# Patient Record
Sex: Male | Born: 1965 | Race: White | Hispanic: No | Smoking: Current every day smoker
Health system: Southern US, Community
[De-identification: ages and names within clinical notes are randomized; demographics above are authoritative.]

## PROBLEM LIST (undated history)

## (undated) DIAGNOSIS — I639 Cerebral infarction, unspecified: Secondary | ICD-10-CM

## (undated) DIAGNOSIS — M549 Dorsalgia, unspecified: Secondary | ICD-10-CM

## (undated) DIAGNOSIS — R269 Unspecified abnormalities of gait and mobility: Secondary | ICD-10-CM

## (undated) DIAGNOSIS — I10 Essential (primary) hypertension: Secondary | ICD-10-CM

## (undated) DIAGNOSIS — W19XXXA Unspecified fall, initial encounter: Secondary | ICD-10-CM

## (undated) DIAGNOSIS — R296 Repeated falls: Secondary | ICD-10-CM

## (undated) DIAGNOSIS — G8929 Other chronic pain: Secondary | ICD-10-CM

## (undated) DIAGNOSIS — E7800 Pure hypercholesterolemia, unspecified: Secondary | ICD-10-CM

## (undated) HISTORY — PX: BACK SURGERY: SHX140

## (undated) HISTORY — DX: Unspecified abnormalities of gait and mobility: R26.9

---

## 2001-06-12 ENCOUNTER — Encounter: Payer: Self-pay | Admitting: Family Medicine

## 2001-06-12 ENCOUNTER — Ambulatory Visit (HOSPITAL_COMMUNITY): Admission: RE | Admit: 2001-06-12 | Discharge: 2001-06-12 | Payer: Self-pay | Admitting: Family Medicine

## 2001-08-11 ENCOUNTER — Emergency Department (HOSPITAL_COMMUNITY): Admission: EM | Admit: 2001-08-11 | Discharge: 2001-08-11 | Payer: Self-pay | Admitting: *Deleted

## 2002-02-10 ENCOUNTER — Encounter: Payer: Self-pay | Admitting: Neurosurgery

## 2002-02-10 ENCOUNTER — Encounter: Admission: RE | Admit: 2002-02-10 | Discharge: 2002-02-10 | Payer: Self-pay | Admitting: Neurosurgery

## 2002-02-25 ENCOUNTER — Encounter: Payer: Self-pay | Admitting: Neurosurgery

## 2002-02-25 ENCOUNTER — Encounter: Admission: RE | Admit: 2002-02-25 | Discharge: 2002-02-25 | Payer: Self-pay | Admitting: Neurosurgery

## 2002-02-28 ENCOUNTER — Emergency Department (HOSPITAL_COMMUNITY): Admission: EM | Admit: 2002-02-28 | Discharge: 2002-02-28 | Payer: Self-pay | Admitting: Emergency Medicine

## 2002-03-06 ENCOUNTER — Encounter: Payer: Self-pay | Admitting: Neurosurgery

## 2002-03-10 ENCOUNTER — Inpatient Hospital Stay (HOSPITAL_COMMUNITY): Admission: RE | Admit: 2002-03-10 | Discharge: 2002-03-11 | Payer: Self-pay | Admitting: Neurosurgery

## 2002-03-10 ENCOUNTER — Encounter: Payer: Self-pay | Admitting: Neurosurgery

## 2002-09-16 ENCOUNTER — Encounter: Payer: Self-pay | Admitting: Neurosurgery

## 2002-09-16 ENCOUNTER — Ambulatory Visit (HOSPITAL_COMMUNITY): Admission: RE | Admit: 2002-09-16 | Discharge: 2002-09-16 | Payer: Self-pay | Admitting: Neurosurgery

## 2002-12-30 ENCOUNTER — Emergency Department (HOSPITAL_COMMUNITY): Admission: EM | Admit: 2002-12-30 | Discharge: 2002-12-31 | Payer: Self-pay | Admitting: *Deleted

## 2003-03-25 ENCOUNTER — Emergency Department (HOSPITAL_COMMUNITY): Admission: EM | Admit: 2003-03-25 | Discharge: 2003-03-26 | Payer: Self-pay | Admitting: *Deleted

## 2003-03-26 ENCOUNTER — Ambulatory Visit (HOSPITAL_COMMUNITY): Admission: RE | Admit: 2003-03-26 | Discharge: 2003-03-26 | Payer: Self-pay | Admitting: *Deleted

## 2003-04-25 ENCOUNTER — Inpatient Hospital Stay (HOSPITAL_COMMUNITY): Admission: EM | Admit: 2003-04-25 | Discharge: 2003-04-28 | Payer: Self-pay | Admitting: *Deleted

## 2003-04-29 ENCOUNTER — Emergency Department (HOSPITAL_COMMUNITY): Admission: EM | Admit: 2003-04-29 | Discharge: 2003-04-30 | Payer: Self-pay | Admitting: *Deleted

## 2003-05-27 ENCOUNTER — Emergency Department (HOSPITAL_COMMUNITY): Admission: EM | Admit: 2003-05-27 | Discharge: 2003-05-27 | Payer: Self-pay | Admitting: Emergency Medicine

## 2004-06-30 ENCOUNTER — Emergency Department (HOSPITAL_COMMUNITY): Admission: EM | Admit: 2004-06-30 | Discharge: 2004-07-01 | Payer: Self-pay | Admitting: *Deleted

## 2004-07-03 ENCOUNTER — Ambulatory Visit (HOSPITAL_COMMUNITY): Admission: RE | Admit: 2004-07-03 | Discharge: 2004-07-03 | Payer: Self-pay | Admitting: Family Medicine

## 2004-07-07 ENCOUNTER — Ambulatory Visit (HOSPITAL_COMMUNITY): Admission: RE | Admit: 2004-07-07 | Discharge: 2004-07-07 | Payer: Self-pay | Admitting: Family Medicine

## 2004-07-14 ENCOUNTER — Inpatient Hospital Stay (HOSPITAL_COMMUNITY): Admission: AD | Admit: 2004-07-14 | Discharge: 2004-07-18 | Payer: Self-pay | Admitting: Family Medicine

## 2004-07-14 ENCOUNTER — Ambulatory Visit: Payer: Self-pay | Admitting: Internal Medicine

## 2004-10-10 ENCOUNTER — Ambulatory Visit: Payer: Self-pay | Admitting: Gastroenterology

## 2004-10-11 ENCOUNTER — Ambulatory Visit (HOSPITAL_COMMUNITY): Admission: RE | Admit: 2004-10-11 | Discharge: 2004-10-11 | Payer: Self-pay | Admitting: Internal Medicine

## 2004-10-15 ENCOUNTER — Emergency Department (HOSPITAL_COMMUNITY): Admission: EM | Admit: 2004-10-15 | Discharge: 2004-10-16 | Payer: Self-pay | Admitting: Emergency Medicine

## 2004-10-18 ENCOUNTER — Ambulatory Visit: Payer: Self-pay | Admitting: Gastroenterology

## 2004-10-25 ENCOUNTER — Ambulatory Visit (HOSPITAL_COMMUNITY): Admission: RE | Admit: 2004-10-25 | Discharge: 2004-10-25 | Payer: Self-pay | Admitting: Urology

## 2004-11-02 ENCOUNTER — Emergency Department (HOSPITAL_COMMUNITY): Admission: EM | Admit: 2004-11-02 | Discharge: 2004-11-02 | Payer: Self-pay | Admitting: Emergency Medicine

## 2004-11-21 ENCOUNTER — Emergency Department (HOSPITAL_COMMUNITY): Admission: EM | Admit: 2004-11-21 | Discharge: 2004-11-21 | Payer: Self-pay | Admitting: Emergency Medicine

## 2005-03-07 ENCOUNTER — Ambulatory Visit (HOSPITAL_COMMUNITY): Admission: RE | Admit: 2005-03-07 | Discharge: 2005-03-07 | Payer: Self-pay | Admitting: Family Medicine

## 2006-11-20 ENCOUNTER — Ambulatory Visit (HOSPITAL_COMMUNITY): Admission: RE | Admit: 2006-11-20 | Discharge: 2006-11-20 | Payer: Self-pay | Admitting: Family Medicine

## 2007-05-17 ENCOUNTER — Emergency Department (HOSPITAL_COMMUNITY): Admission: EM | Admit: 2007-05-17 | Discharge: 2007-05-17 | Payer: Self-pay | Admitting: Emergency Medicine

## 2008-02-16 ENCOUNTER — Ambulatory Visit (HOSPITAL_COMMUNITY): Admission: RE | Admit: 2008-02-16 | Discharge: 2008-02-16 | Payer: Self-pay | Admitting: Family Medicine

## 2009-06-20 ENCOUNTER — Emergency Department (HOSPITAL_COMMUNITY): Admission: EM | Admit: 2009-06-20 | Discharge: 2009-06-20 | Payer: Self-pay | Admitting: Emergency Medicine

## 2010-03-19 ENCOUNTER — Encounter: Payer: Self-pay | Admitting: Cardiology

## 2010-07-14 NOTE — Group Therapy Note (Signed)
NAME:  Tim Schwartz, Tim Schwartz               ACCOUNT NO.:  [id number]   MEDICAL RECORD NO.:  [id number]          PATIENT TYPE:  INP   LOCATION:  A336                          FACILITY:  [facility]   PHYSICIAN:  Angus G. Renard Matter, MD   DATE OF BIRTH:  May 18, 1965   DATE OF PROCEDURE:  07/17/2004  DATE OF DISCHARGE:                                   PROGRESS NOTE   SUBJECTIVE:  This patient continues to have right flank pain. CT of the  abdomen revealed small right pleural effusion with atelectasis. Otherwise  normal study.   OBJECTIVE:  VITAL SIGNS:  Blood pressure 131/80, respirations 20, pulse 91,  temperature 97.6.  LUNGS:  Clear to P&A.  HEART:  Regular rhythm.  ABDOMEN:  The patient has some tenderness over the right upper and mid  abdomen.   ASSESSMENT:  The patient is being evaluated for abdominal pain.   PLAN:  Continue current regimen.      AGM/MEDQ  D:  07/17/2004  T:  07/17/2004  Job:  [id number]

## 2010-07-14 NOTE — Group Therapy Note (Signed)
NAME:  Tim Schwartz, Tim Schwartz               ACCOUNT NO.:  [id number]   MEDICAL RECORD NO.:  [id number]          PATIENT TYPE:  INP   LOCATION:  A336                          FACILITY:  [facility]   PHYSICIAN:  Angus G. Renard Matter, MD   DATE OF BIRTH:  [birth year]   DATE OF PROCEDURE:  07/15/2004  DATE OF DISCHARGE:                                   PROGRESS NOTE   SUBJECTIVE:  This patient was admitted with abdominal pain which he has had  over the past month.  He has had to receive increasing dosages of Dilaudid  in order to control pain.  He was seen by an urologist as well.   OBJECTIVE:  VITAL SIGNS:  Blood pressure 133/90, respirations 20, pulse 83,  temperature 98.1.  LUNGS:  Clear.  HEART:  Regular rhythm.  ABDOMEN:  Slight tenderness in the right flank.   ASSESSMENT:  Patient admitted with right flank pain.   PLAN:  Continue pain control.  The patient was seen in consultation by  urology.      AGM/MEDQ  D:  07/15/2004  T:  07/15/2004  Job:  [id number]

## 2010-07-14 NOTE — Discharge Summary (Signed)
NAME:  Tim Schwartz, Tim Schwartz               ACCOUNT NO.:  [id number]   MEDICAL RECORD NO.:  [id number]          PATIENT TYPE:  INP   LOCATION:  A336                          FACILITY:  [facility]   PHYSICIAN:  Angus G. Renard Matter, MD   DATE OF BIRTH:  September 22, 1965   DATE OF ADMISSION:  07/14/2004  DATE OF DISCHARGE:  05/23/2006LH                                 DISCHARGE SUMMARY   DIAGNOSES:  1.  Right flank and upper abdominal pain.  2.  Hematuria.  3.  Pyuria.  4.  Possible musculoskeletal pain.   HISTORY OF PRESENT ILLNESS:  This is a 45 year old white male admitted with  his chief complaint being right flank pain and upper abdominal pain for  possibly two weeks duration.  This has been moderately severe.  He had been  seen both at my office and at [facility] with flank pain.  He  first was evaluated in [place].  He had a CT of his abdomen, which did not show  evidence of urolithiasis but some pleural thickening.  A subsequent CT was  done on Jun 26, 2004, again at [facility], which showed consolidation  or round atelectasis in the right lower lobe.  A subsequent chest x-ray was  performed, which was negative with the exception of mild pleural thickening.  The patient had an ultrasound done, which showed evidence of sludge in the  gallbladder without cholelithiasis or cholecystitis.  A more recent CBC  showed a white count of 12,900 with 76 neutrophils and 9.8 absolute  neutrophils.  The patient had been placed on Voltaren and doxycycline.  He  continued to have significant pain rated on a scale of 1 to 10 of about 6 or  7.  He was again seen in the ED at [facility] and was noted to have a  low serum potassium and evidence of a urinary infection and hematuria.  He  was admitted for further evaluation.   PHYSICAL EXAMINATION:  GENERAL:  An alert uncomfortable white male.  VITAL SIGNS:  Blood pressure on admission 151/92, respirations 20, pulse 87,  temperature 97.6.  HEENT:   Eyes PERRLA.  TM negative.  Oropharynx benign.  NECK:  Supple.  No JVD or thyroid abnormalities.  LUNGS:  Clear to P&A.  HEART:  Regular rhythm.  No murmurs, no cardiomegaly.  ABDOMEN:  No palpable organs or masses.  No organomegaly.  The patient is  slightly tender in the right flank.   LABORATORY DATA:  Admission CBC, WBC 10,000, hemoglobin 15.3, hematocrit  44.5.  Chemistries sodium 135, potassium 3.5, chloride 98, CO2 of 28,  glucose 89, BUN 9, creatinine 0.9, calcium 8.4.  Liver enzymes SGOT 14, SGPT  14, alkaline phosphatase 81, total bilirubin 0.4.   X-RAY STUDIES:  A CT of the pelvis with contrast no ureteral bladder calculi  identified.  Normal CT of the pelvis.  An MRI of the thoracic spine.  No  evidence of thoracic disk herniation or cord compression.  A small right-  sided pleural effusion.   HOSPITAL COURSE:  At the time of his admission, he  was placed on IV fluids  half-normal saline, KVO rate.  Vital signs were monitored q.i.d.  He was  placed on Lovenox subcutaneously daily, Dilaudid 1 mg every 4 hours p.r.n.  for pain, and Zofran 8 mg IV q.8h. p.r.n. for nausea.  The patient was seen  in consultation by Dr. Rito Ehrlich, who felt this could be pleuritic pain and  was atypical for kidney stones.  The patient was also seen in consultation  by Dr. Karilyn Cota, who felt that this could be irritable bowel.  He recommended  a trial of dicyclomine 25 mg before each meal.  Liver function tests were  obtained.  The patient improved during his hospital stay.  He was able to be  discharged on the fourth hospital day.   DISCHARGE MEDICATIONS:  1.  Neurontin 100 mg t.i.d.  2.  Percocet tablets 5/325 q.4h. p.r.n.  3.  Bentyl hydrochloride 120 mg t.i.d.  4.  Lidoderm patch.       AGM/MEDQ  D:  08/03/2004  T:  08/03/2004  Job:  [id number]

## 2010-07-14 NOTE — Consult Note (Signed)
NAME:  Tim Schwartz, Tim Schwartz                         ACCOUNT NO.:  [id number]   MEDICAL RECORD NO.:  [id number]                   PATIENT TYPE:  INP   LOCATION:  A210                                 FACILITY:  [facility]   PHYSICIAN:  R. Roetta Sessions, M.D.              DATE OF BIRTH:  [birth year]   DATE OF CONSULTATION:  DATE OF DISCHARGE:                                   CONSULTATION   REQUESTING PHYSICIAN:  Dr. Mila Homer. [place].   REASON FOR CONSULTATION:  Colitis.   HISTORY OF PRESENT ILLNESS:  Tim Schwartz is a 45 year old Caucasian  male who was hospitalized on April 25, 2003 for severe right-sided  abdominal pain.  Mr. Chaffin reports a 37-month history of intermittent right-  sided abdominal pain, intermittent small-to-moderate-volume hematochezia,  mucousy stools up to 3 times per day, as well as nausea and occasional  emesis.  Dr. Sudie Bailey ordered a CT scan of abdomen and pelvis which revealed  a questionable segmental thickening of the colon at the hepatic flexure.  He  also had an abdominal ultrasound which revealed a distended gallbladder with  sludge, without any evidence of cholelithiasis or thickening of the  gallbladder wall.  CBD was normal at 5 mm.  There was a questionable small  hepatic hemangioma.  HIDA scan is pending at this time.  [name] states  that the pain is cramping and intermittent; he also describes several  loose stools a day, up to 3 at times.  He noted bright red rectal bleeding  in the past intermittently over the last 2 months.  He was seen on an  outpatient basis by Dr. Sudie Bailey and started on Levaquin and Flagyl.  He  denies any aspirin, NSAID, or Goody's or BC powders use.  He states he has  never had any chronic GI concerns before.   PAST MEDICAL HISTORY:  1. Hypertension.  2. GERD, well-controlled on over-the-counter antacids.   PAST SURGICAL HISTORY:  Herniated disk repair at L5-S1 by Dr. Payton Doughty in  January 2004.   MEDICATIONS  PRIOR TO ADMISSION:  1. Norvasc 5 mg p.o. daily.  2. Lorcet 10/650 mg p.r.n. q.4-6 h. for severe pain.   ALLERGIES:  No known drug allergies.   FAMILY HISTORY:  No known family history of colorectal carcinoma, liver or  chronic GI problems.  Mother, age 33, has a history of hypertension.  Father, age 71, deceased secondary to lung carcinoma.  He has 2 brothers,  both of whom are healthy.   SOCIAL HISTORY:  Tim Schwartz states he is currently divorced and lives alone.  He does have a healthy 62-year-old daughter who resides with his ex-wife.  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  He  currently does not smoke, however, he reports a 1-pack-per-day history  intermittently for  a total of approximately 12 years.  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   REVIEW OF SYSTEMS:  CONSTITUTIONAL:  Weight is stable.  Appetite is okay.  Denies any fever.  Does report occasional chills.  He is complaining of  fatigue.  GENITOURINARY:  Is having some difficulty initiating stream.  Denies any dysuria or hematuria.  GI:  See HPI.  Denies any dysphagia or  odynophagia.  Does have history of GERD.  Apparently underwent upper GI  series in 1999 which revealed a hiatal hernia but was otherwise normal per  patient report.   PHYSICAL EXAMINATION:  VITAL SIGNS:  Temperature 97.1, pulse 68,  respirations 18, blood pressure 117/69.  GENERAL:  [name] is a well-developed, well-nourished Caucasian male in no  acute distress.  He is alert and oriented, pleasant and cooperative.  HEENT:  Sclerae clear, nonicteric.  Conjunctivae pink.  Oropharynx pink and  moist without any lesions.  NECK:  Neck supple without any mass or thyromegaly.  CHEST:  Heart regular rate and rhythm with normal S1 and S2, without  any  murmurs, clicks, rubs or gallops.  Lungs clear to auscultation bilaterally.  ABDOMEN:  Positive bowel sounds x4.  Soft.  Does have mild tenderness to  deep palpation, right upper quadrant to periumbilical area, as well as right  lower quadrant adjacent to the umbilicus as well.  No discrete masses or  hepatosplenomegaly.  No guarding.  No rebound tenderness.  Negative  iliopsoas.  Negative Murphy's sign.  EXTREMITIES:  Pedal pulses 2+ bilaterally.  No edema.  SKIN:  Pink, warm and dry without any rash or jaundice.   LABORATORY STUDIES:  Mr. Feick did have mild leukocytosis on admission with  WBC count of 14.4 on April 25, 2003.  Today, WBC is down to 8.4,  hemoglobin 13.8, hematocrit 40, platelets __________; calcium 8.4, sodium  140, potassium 3.8, chloride 107, CO2 29, BUN 9, creatinine 0.9.  LFTs all  within normal limits.  From April 25, 2003, amylase 59, lipase 33.   HIDA scan results have been received which show gallbladder ejection  fraction of 70% and an overall normal preliminary report was received from  radiology.   ASSESSMENT:  Tim Schwartz is a 45 year old Caucasian male with probable  colitis, given his constellation of symptoms including abdominal pain, loose  mucousy stools combined with intermittent hematochezia, and CT findings:  He  does have significant risk factors for ischemic colitis, however, his  symptoms are not classic.  Another possibility is infectious colitis.  Inflammatory bowel disease should be ruled out as well, however, his signs  are definitely atypical and he does not portray any red flags.  Another  possibility is localized diverticulitis not picked up on CT scan.  Further  evaluation is therefore necessary.   RECOMMENDATIONS:  1. Will need colonoscopy and probable biopsy in the near future.  I have     discussed this case with Dr. Jonathon Bellows and he is planning on    performing procedure afternoon of April 27, 2003.  We will  place the     patient on a clear liquid diet, clear liquid breakfast in the morning,     n.p.o. post 8 o'clock.  We will prep with GoLYTELY tonight and give 2     Fleet Enemas in the morning.  Consent will be obtained  and Dr. Jena Gauss has     discussed the procedure including risks and benefits which include, but     are not limited to, bleeding, perforation, infection and drug reaction     with Mr. Dugal; he agrees with this plan.  2. Continue Dilaudid p.r.n. for pain, and he does appear to have a     relatively high pain tolerance.  3. Further recommendations pending procedure.   COMMENT:  We would like to thank Dr. Sudie Bailey for allowing [place] to participate  in the care of Mr. Dantonio.     ________________________________________  ___________________________________________  Nicholas Lose, N.P.                  Jonathon Bellows, M.D.   KC/MEDQ  D:  04/26/2003  T:  04/27/2003  Job:  [id number]   cc:   Mila Homer. Sudie Bailey, M.D.  [address] [place], [place] [address]  Fax: [phone]   R. Roetta Sessions, M.D.  [address]  [place]  [place] [address]  Fax: [phone]

## 2010-07-14 NOTE — Group Therapy Note (Signed)
NAME:  Tim Schwartz, Tim Schwartz                         ACCOUNT NO.:  [id number]   MEDICAL RECORD NO.:  [id number]                   PATIENT TYPE:  INP   LOCATION:  A210                                 FACILITY:  [facility]   PHYSICIAN:  Mila Homer. Sudie Bailey, M.D.           DATE OF BIRTH:  17-Jun-1965   DATE OF PROCEDURE:  04/26/2003  DATE OF DISCHARGE:                                   PROGRESS NOTE   SUBJECTIVE:  The patient is complaining of a lot more right sided pain.  He  had been using Dilaudid 4 mg every 4 hours.  IV.   OBJECTIVE:  He is calmly supine in bed.  No acute distress.  Well-developed,  well-nourished, and looks absolutely relaxed.  Does not show any sign of  pain.  Temperature 97.1, pulse 68, respirations 18, blood pressure 117/69.  Abdomen is soft without  hepatosplenomegaly or mass and on deep palpation of  the right part of the abdomen does complain of some pain there.  Color is  good.   ASSESSMENT:  Abdominal pain, question etiology, possibly this is due to  gallstones or colitis.   DISCHARGE MEDICATIONS:  1. Continue the Levaquin 500 mg IV daily.  2. Flagyl 500 mg IV t.i.d.  3. Currently Dilaudid 4 mg IV q.4h.  4. Promethazine 25 mg IV q.3h.   PLAN:  He is due to have his hepatobiliary study today and to see Dr. Karilyn Cota  and Dr. Jena Gauss of GI and Dr. Katrinka Blazing of General Surgery for further evaluation.  I have questioned the patient and his mom at length to see whether he has  used any narcotics before coming to the hospital.  Since he does not seem to  have nearly the pain I expect in a patient taking this much Dilaudid.  But,  other than taking a couple of pain pills the night before coming to the ER  (which he got from Dr. Channing Mutters for his back surgery last year), he denies any  other pain medication use.  Denies seeing any regular medical doctor and  really says he has not seen a doctor in the last year since his back surgery  by Dr. Channing Mutters.      ___________________________________________                                            Mila Homer. Sudie Bailey, M.D.   SDK/MEDQ  D:  04/26/2003  T:  04/26/2003  Job:  [id number]

## 2010-07-14 NOTE — Discharge Summary (Signed)
NAME:  Tim Schwartz, Tim Schwartz                         ACCOUNT NO.:  [id number]   MEDICAL RECORD NO.:  [id number]                   PATIENT TYPE:  INP   LOCATION:  A210                                 FACILITY:  [facility]   PHYSICIAN:  Mila Homer. Sudie Bailey, M.D.           DATE OF BIRTH:  1966/01/29   DATE OF ADMISSION:  04/24/2003  DATE OF DISCHARGE:                                 DISCHARGE SUMMARY   HOSPITAL COURSE:  This 45 year old man presented to the hospital with  complaining of fairly severe right-side abdominal pain which he complained  had been going on for 2 months but was worse recently.   He had a benign 4-day hospitalization extending from April 25, 2003 to  April 28, 2003.  Vital signs remained stable during this time.   His admission white cell count was 14,400 of which 70% were neutrophils, 25  lymphs, 4 monos.  His MET-7 showed potassium 3.2, glucose 127, BUN 7,  creatinine 0.9.  LFTs were normal as was amylase and lipase.  UA shows a  specific gravity greater than 1.030 with 0-2 wbc's, 0-2 rbc's per HPF, and  rare bacteria.  Recheck of MET-7 his second day showed the potassium up to  3.8 and the CBC showed the white cell count now 8400 with 47 neutrophils, 45  lymphs, 6 monos.  Pending at the time of discharge were a stool culture,  O&P.  Fecal white cells were negative and Clostridium difficile toxin was  negative.   His initial CT of the abdomen showed mild fatty changes in the liver and the  question of localized wall thickening in the colon and hepatic flexure  region felt to be possibly related to colitis.  His pelvic CT was negative.  Given he had sludge in his gallbladder a month prior to admission,  hepatobiliary nuclear scan was done which showed gallbladder activity at 12  minutes, bowel activity at 8 minutes, and gallbladder ejection fraction of  70% at 30 minutes.   He was seen by both Dr. Jena Gauss, gastroenterologist, and Dr. Katrinka Blazing, general  surgeon.  Dr. Jena Gauss  did perform a colonoscopy on the patient's hospital day  #3.  This was totally normal.  He had no endoscopic explanation for the  patient's abdominal pain.   The patient's hospital day #4 he was doing well, eating well, and his  abdomen was absolutely benign without hepatosplenomegaly or mass, without  tenderness, and I felt that he was stable for discharge home   FINAL DISCHARGE DIAGNOSES:  1. Abdominal pain, question etiology - possibly secondary to biliary tract     disease.  2. Low pain tolerance with high use of narcotics in hospital.   He is being discharged home with Tylenol for pain, follow up with [place] if  needed.     ___________________________________________  Mila Homer. Sudie Bailey, M.D.   SDK/MEDQ  D:  04/28/2003  T:  04/28/2003  Job:  [id number]   cc:   R. Roetta Sessions, M.D.  [address]  [place]  [place] [address]  Fax: [phone]   Dirk Dress. Katrinka Blazing, M.D.  [address]  [place]  [place] [address]  Fax: [phone]

## 2010-07-14 NOTE — Group Therapy Note (Signed)
NAME:  Tim Schwartz, Tim Schwartz               ACCOUNT NO.:  [id number]   MEDICAL RECORD NO.:  [id number]          PATIENT TYPE:  INP   LOCATION:  A336                          FACILITY:  [facility]   PHYSICIAN:  Angus G. Renard Matter, MD   DATE OF BIRTH:  1966-01-29   DATE OF PROCEDURE:  07/16/2004  DATE OF DISCHARGE:                                   PROGRESS NOTE   HISTORY OF PRESENT ILLNESS:  This patient was admitted with severe right  flank pain and right upper quadrant pain.  He has had to receive IV Dilaudid  for pain control, but continues to have a moderate amount of pain.  He has  been seen gastroenterology service as well as neurology service.   OBJECTIVE:  VITAL SIGNS:  Blood pressure 137/73, respirations 20, pulse 83,  temperature 98.1.  LUNGS:  Clear to auscultation and percussion.  HEART:  Regular rhythm.  ABDOMEN:  Tenderness in the right flank, right upper quadrant.   ASSESSMENT:  The patient was admitted with right flank pain, right upper  quadrant pain.  He has known sludging of the gallbladder, and had minimal  amount of blood in urine.   PLAN:  Continue current regimen for pain control.  We will discuss  advisability of another abdominal CT scan with Dr. Karilyn Cota.      AGM/MEDQ  D:  07/16/2004  T:  07/16/2004  Job:  [id number]

## 2010-07-14 NOTE — Op Note (Signed)
NAME:  Tim Schwartz, Tim Schwartz                         ACCOUNT NO.:  [id number]   MEDICAL RECORD NO.:  [id number]                   PATIENT TYPE:  INP   LOCATION:  A210                                 FACILITY:  [facility]   PHYSICIAN:  R. Roetta Sessions, M.D.              DATE OF BIRTH:  08-24-1965   DATE OF PROCEDURE:  04/27/2003  DATE OF DISCHARGE:  04/28/2003                                 OPERATIVE REPORT   PROCEDURE:  Diagnostic colonoscopy.   INDICATIONS FOR PROCEDURE:  The patient is a 45 year old gentleman admitted  to the hospital with acute-on-chronic right-sided abdominal pain.  Because  of a history of small-volume hematochezia and mucous-like bowel movements.  Recent CT of the abdomen demonstrated thickening in a segment of the colon  in the area of the hepatic flexure.  He also has sludge and gallstones in  the gallbladder.  HIDA demonstrated the gallbladder ejection fraction of  70%.  Colonoscopy is now being done to further evaluate his bowel symptoms  and the abnormalities seen on CT.  This approach has been discussed with the  patient at length.  The potential risks, benefits, and alternatives have  been reviewed.  Please see the documentation in the medical record.   PROCEDURE:  O2 saturation, blood pressure, pulses, and respirations were  monitored throughout the entirety of the procedure.  Conscious sedation was  with IV Versed and Demerol in incremental doses.  The instrument used was  the Olympus CF-140 colonoscope.   FINDINGS:  Digital rectal examination revealed no abnormalities.   ENDOSCOPIC FINDINGS:  The prep was adequate.   Rectum:  Examination of the rectal mucosa including retroflex view of the  anal verge revealed normal rectal mucosa.  There were minimal internal  hemorrhoids.   Colon:  The colonic mucosa was surveyed from the rectosigmoid junction  through the left, transverse, right colon to the area of the appendiceal  orifice, ileocecal valve, and cecum.   These structures were well-seen and  photographed for the record.  From this level, the scope was slowly  withdrawn.  All previously mentioned mucosal surfaces were again seen.  The  colonic mucosa appeared normal.  There was no abnormality involving the  colonic mucosa seen.  Stool residue was suctioned for microbiology studies.  The patient tolerated the procedure well and was reactive in endoscopy.   IMPRESSION:  1. Normal rectum.  2. Normal colon.  Stool sample taken.  3. No endoscopic explanation for the patient's symptoms, although he may     have had a low-grade infectious disease recently producing diarrhea;     however, I do not feel that that would explain his right-sided abdominal     pain.   RECOMMENDATIONS:  1. Will follow up on stool studies.  2. Agree with surgical opinion in regards to cholelithiasis.  3. Further recommendations to follow.      ___________________________________________  Tim Schwartz, M.D.   RMR/MEDQ  D:  04/28/2003  T:  04/28/2003  Job:  [id number]   cc:   Mila Homer. Sudie Bailey, M.D.  [address] [place], [place] [address]  Fax: [phone]

## 2010-07-14 NOTE — H&P (Signed)
NAME:  Tim Schwartz, Tim Schwartz               ACCOUNT NO.:  [id number]   MEDICAL RECORD NO.:  [id number]          PATIENT TYPE:  INP   LOCATION:  A336                          FACILITY:  [facility]   PHYSICIAN:  Angus G. Renard Matter, MD   DATE OF BIRTH:  [birth year]   DATE OF ADMISSION:  07/14/2004  DATE OF DISCHARGE:  LH                                HISTORY & PHYSICAL   A 45 year old white male was admitted with a chief complaint being right  flank and upper abdominal pain for approximately two weeks' duration.  This  has been moderately severe.  The patient has been seen both at my office and  at [facility] Emergency Room with flank pain.  He first was evaluated in [place],  had a CT of his abdomen which did not show evidence of urolithiasis, on  June 23, 2004, but some pleural thickening.  A subsequent CT was done, on  Jun 26, 2004, again at [facility] which showed a consolidation or  round atelectasis in the right lower lobe.  A subsequent chest x-ray was  performed which was negative with the exception of a mild pleural  thickening.  The patient had an ultrasound done which showed evidence of  sludge in the gallbladder without cholelithiasis or cholecystitis.  A more  recent CBC showed a white count of 12,900 with 76 neutrophils, 9.8 absolute  neutrophils.  The patient had been placed on Voltaren and doxycycline,  continued to have significant pain rated on a scale of 1 to 10 at about 6 or  7.  Again was seen in the ED at [facility] last evening and was told  he had low serum potassium and evidence of urinary infection with hematuria.  The patient is being admitted for further evaluation.   SOCIAL HISTORY:  The patient does not drink alcohol but continues to smoke  cigarettes.   FAMILY HISTORY:  The patient had a father who died of lung cancer.   The patient has no known allergies.   PAST MEDICAL AND SURGICAL HISTORY:  1.  The patient has had no previous surgery.  2.  He does have a  history of hiatal hernia.  3.  He does have a history of prior kidney colic, has been evaluated by      urologist, Dr. Jerre Simon.   REVIEW OF SYSTEMS:  HEENT:  Negative.  CARDIOPULMONARY:  No cough,  hemoptysis, or dyspnea.  GI:  No vomiting or diarrhea.  Right flank and  upper abdominal pain.  GENITOURINARY:  No frequency, urgency.  The patient  was told that he had blood in his urine.   PHYSICAL EXAMINATION:  GENERAL:  Alert but uncomfortable white male.  VITALS SIGNS:  BP,  respirations , temp .  HEENT:  Eyes, PERRLA.  TMs negative.  Oropharynx benign.  NECK:  Supple.  No JVD or thyroid abnormalities.  LUNGS:  Clear to P&A.  HEART:  Regular rhythm.  No murmurs.  No cardiomegaly.  ABDOMEN:  No palpable organs or masses.  No organomegaly.   ASSESSMENT:  1.  The patient was admitted with right  flank and upper abdominal pain of      undetermined cause.  2.  Hematuria.  3.  Pyuria.      AGM/MEDQ  D:  07/14/2004  T:  07/14/2004  Job:  [id number]

## 2010-07-14 NOTE — Group Therapy Note (Signed)
NAME:  Tim Schwartz, Tim Schwartz                         ACCOUNT NO.:  [id number]   MEDICAL RECORD NO.:  [id number]                   PATIENT TYPE:  INP   LOCATION:  A210                                 FACILITY:  [facility]   PHYSICIAN:  Mila Homer. Sudie Bailey, M.D.           DATE OF BIRTH:  Jun 13, 1965   DATE OF PROCEDURE:  DATE OF DISCHARGE:                                   PROGRESS NOTE   SUBJECTIVE:  He is still having right-sided abdominal pain.   OBJECTIVE:  Temperature is 98 degrees, pulse 75, respiratory rate 18, blood  pressure 126/76.  He has mild pain in the right side of the abdomen on  palpation.   ASSESSMENT:  Abdominal pain.   PLAN:  He is currently getting cleaned out for a colonoscopy this afternoon  through Dr. Roetta Sessions.  At that point we should have a better idea of  whether he has an acute colitis, Crohn's disease, etc.  Meanwhile, recommend  he stop his smoking.      ___________________________________________                                            Mila Homer. Sudie Bailey, M.D.   SDK/MEDQ  D:  04/27/2003  T:  04/27/2003  Job:  [id number]

## 2010-07-14 NOTE — H&P (Signed)
NAME:  Tim Schwartz, Tim Schwartz                         ACCOUNT NO.:  [id number]   MEDICAL RECORD NO.:  [id number]                   PATIENT TYPE:  INP   LOCATION:  A210                                 FACILITY:  [facility]   PHYSICIAN:  Mila Homer. Sudie Bailey, M.D.           DATE OF BIRTH:  1965-12-26   DATE OF ADMISSION:  04/24/2003  DATE OF DISCHARGE:                                HISTORY & PHYSICAL   HISTORY OF PRESENT ILLNESS:  This 45 year old man has been having right-  sided abdominal pain for about two months.  He has been up to the [facility]  Emergency Room at least one other time and was seen in the office once.  The  last couple of days he has had more abdominal pain along with some vomiting.  Stools stay loose and yet he knows he is able to tolerate clear liquids.  He  came to the ER for evaluation.   PAST MEDICAL HISTORY:  He has generally been healthy.  Surgery is limited to  surgery on a herniated disk at L5-S1 by Dr. Trey Sailors in January 2004.  He  has had no other surgical procedures and no other hospitalizations.   SOCIAL HISTORY:  He currently smokes cigarettes, but was off them for about  10 years.  Stopped alcohol 5 years ago, and does not use drugs.   MEDICATIONS:  He is on no other medications.   Reviewing his tests in the past, he had an urine with too numerous to count  RBCs, also was nitrite positive.  At that time, he had a CT scan of his  abdomen and pelvis which was absolutely normal, however, the gallbladder  ultrasound showed sludge in the gallbladder and there was a sonographically  positive Murphy's sign.   PHYSICAL EXAMINATION:  GENERAL:  A pleasant middle-aged man who was in  severe distress, complaining of pain at an 8 on a scale of 1 to 10.  On my  exam, he was in no acute distress.  He is well-developed, well-nourished,  oriented and alert.  His mom was in attendance.  VITAL SIGNS:  His temperature was 99 degrees, the blood pressure was 159/85,  the pulse  101, the respiratory rate 20.  During the course of the night the  BP stabilized at 129/84, and the pulse was 79.  NECK:  There was no axillary or supraclavicular adenopathy.  HEART:  Regular rate and rhythm at about 70.  LUNGS:  Clear throughout.  He is moving air well.  ABDOMEN:  Soft without hepatosplenomegaly or mass.  Really no tenderness  with palpation today.  Bowel sounds were quiet.  EXTREMITIES:  No edema of the ankles.   LABORATORY DATA:  His admission white blood cell count was 14,400, of which  75% neutrophils, 15% lymphs.  His H&H was 16.4/49.9, platelet count 327,000.  His Met-7 showed a potassium of 3.2, glucose 125.  Of note, the  urine shows  specific gravity greater than 1030.   He had a CT scan of the abdomen which apparently showed a colitis in the  right colon.  The uro doctor conveyed this to me, but there is no record in  the paper chart or the electronic chart confirming this.   ADMISSION DIAGNOSES:  1. Colitis.  2. Cholelithiasis.  3. Hypokalemia.  4. Tobacco use disorder.  5. Status post L4-L5 disk surgery.   PLAN:  Metronidazole 500 mg t.i.d. IV and Levaquin 500 mg IV daily.  We will  continue with IV fluids.  Arrange for a hepatobiliary study in the morning,  and if it does turn out he does have a colitis established by CT scan, we  will have Dr. Jena Gauss, gastroenterology, review his case.  I have already  discussed the case briefly with Dr. Karilyn Cota.  At this point since we do not  have a CT scan available we are awaiting for official consultation.  I  discussed with the patient and his mom at length.  We will add K-Dur 20 mEq  t.i.d. for two days and then daily.  Continue with a clear liquid diet with  Dilaudid 4 mg IV q.4h. for severe pain.  Recheck a CBC and Met-7 in the  morning.   I spent time on this patient reviewing his electronic and paper record,  talking to the patient, examining the patient, discussed the case with his  mom, also Dr. Karilyn Cota,  gastroenterologist took 50 minutes.     ___________________________________________                                         Mila Homer Sudie Bailey, M.D.   SDK/MEDQ  D:  04/25/2003  T:  04/25/2003  Job:  [id number]

## 2010-07-14 NOTE — Op Note (Signed)
NAME:  Tim Schwartz, Tim Schwartz                         ACCOUNT NO.:  [id number]   MEDICAL RECORD NO.:  [id number]                   PATIENT TYPE:  INP   LOCATION:  3172                                 FACILITY:  [facility]   PHYSICIAN:  Payton Doughty, M.D.                   DATE OF BIRTH:  14-Aug-1965   DATE OF PROCEDURE:  03/10/2002  DATE OF DISCHARGE:                                 OPERATIVE REPORT   PREOPERATIVE DIAGNOSIS:  Herniated nucleus pulposus L5-S1, right.   POSTOPERATIVE DIAGNOSIS:  Herniated nucleus pulposus L5-S1, right.   PROCEDURE:  An L5-S1 laminectomy, diskectomy on the right side.   SURGEON:  Payton Doughty, M.D.   ASSISTANT:  Stefani Dama, M.D.   ANESTHESIA:  General endotracheal.   COMPLICATIONS:  None.   PREPARATION:  Prepped and scrubbed with alcohol wipe.   INDICATIONS FOR PROCEDURE:  This is a 45 year old, right-handed white  gentleman with a herniated disk on the right side at L5-S1.  He is taken to  the operating room and smoothly anesthetized and intubated, and placed prone  on the operating room table.  He was shaved,  prepped and draped in the  usual sterile fashion.  The skin was infiltrated with 1% lidocaine with  4:00,000 epinephrine.  The skin was incised from the mid-S1 to mid-L5.  The  lamina of L5 was exposed on the right side in the subperiosteal plane, and  an intraoperative x-ray confirmed the correctness of the level.  Self-  retaining retractors were placed.  A hemisemilaminectomy at L5 was carried  out to the top of the ligament of flavum.  It was removed in a retrograde  fashion.  The right S1 nerve root was thus identified and retracted  medially.  Immediately underneath the nerve root was the sub-ligamentous  fragment of disk.  The posterior longitudinal ligament was divided,  immediately productive of several large fragments of disk.  These were  removed and the disk space carefully explored.  Fragments were removed.  The  wound was  irrigated and hemostasis was assured.  Depo-Medrol and some fat  was used to fill the laminectomy defect.  The fascia was reapproximated with  #0 Vicryl in an interrupted fashion.  The subcutaneous tissues were  approximated with #0 Vicryl in an interrupted fashion.  The subcuticular  tissues were reapproximated with #3-0 Vicryl in an interrupted fashion.  The  skin was closed with #4-0 Vicryl in a running subcuticular fashion.  Benzoin  and Steri-Strips were placed and covered with Telfa and Op-Site.  The patient returned to the recovery room in good condition.                                                 Loraine Leriche  Delora Fuel, M.D.    MWR/MEDQ  D:  03/10/2002  T:  03/10/2002  Job:  [id number]

## 2010-07-14 NOTE — Consult Note (Signed)
NAME:  Tim Schwartz, Tim Schwartz NO.:  [id number]   MEDICAL RECORD NO.:  [id number]          PATIENT TYPE:  INP   LOCATION:  A336                          FACILITY:  [facility]   PHYSICIAN:  Dennie Maizes, M.D.   DATE OF BIRTH:  Feb 17, 1966   DATE OF CONSULTATION:  07/14/2004  DATE OF DISCHARGE:                                   CONSULTATION   REASON FOR CONSULTATION:  Right upper quadrant abdominal pain and right  flank pain.   REPORT:  This 45 year old male had been admitted to the hospital with severe  intermittent right upper quadrant abdominal pain and mild right flank pain  for the past two weeks.  The patient has been seen in the emergency room at  [facility] x2, and in Dr. Renard Matter' office.  He had a CT scan  of the abdomen and pelvis without contrast on June 23, 2004.  There is no  evidence of ureterolithiasis or urinary tract obstruction.  Pleural  thickening was noted on the right side.  CT scan done on Jun 26, 2004  revealed consolidation of atelectasis in the right lower lobe.  Chest x-ray  was repeated for followup, and this revealed mild right pleural thickening.  The patient has also been evaluated with renal abdominal ultrasound, which  revealed sludge in the gallbladder without cholelithiasis or cholecystitis.  The patient has continued doxycycline.  He continues to have intermittent  right upper quadrant abdominal pain.  He denied having any voiding symptoms.  No history of fever, chills, dysuria, or hematuria.  He had been passing  dark-colored urine.   PAST MEDICAL HISTORY:  Negative for ureterolithiasis.  Status post lumbar  laminectomy and diskectomy.  History of hiatal hernia.   MEDICATIONS:  Lorcet Plus for back pain.   ALLERGIES:  None.   PHYSICAL EXAMINATION:  GENERAL:  The patient is comfortable at the time of  examination.  ABDOMEN:  Soft.  No palpable flank mass, CVA tenderness, or right upper  quadrant abdominal tenderness.   Bladder not palpable.  No suprapubic  tenderness.  GENITOURINARY:  Penis normal.  Testes are normal.   Urinalysis and urine culture and sensitivity are pending.   ADMISSION LABS:  BUN 9, creatinine 0.9.  Electrolytes within normal range.  CBC:  WBC 10, hemoglobin 15.3, hematocrit 44.5.   IMPRESSION:  Right upper quadrant and right flank pain:  The pain is  aggravated by coughing.  I feel the pain might be pleuritic pain.  This is  atypical for kidney stones.  Await urinalysis and urine culture and  sensitivity results.  Will review the x-rays at [facility].  Will discuss with Dr. Renard Matter in the a.m.   Thank you for this consult.      SK/MEDQ  D:  07/14/2004  T:  07/14/2004  Job:  [id number]   cc:   Angus G. Renard Matter, MD  [address]  [place]  [place] [address]  Fax: [phone]

## 2010-07-14 NOTE — Consult Note (Signed)
NAMEJANTZ, MAIN NO.:  [id number]   MEDICAL RECORD NO.:  [id number]          PATIENT TYPE:  INP   LOCATION:  A336                          FACILITY:  [facility]   PHYSICIAN:  Lionel December, M.D.    DATE OF BIRTH:  March 29, 1965   DATE OF CONSULTATION:  07/16/2004  DATE OF DISCHARGE:                                   CONSULTATION   REASON FOR CONSULTATION:  Right mid abdominal pain.   HISTORY OF PRESENT ILLNESS:  Tim Schwartz is a 45 year old Caucasian male who was  admitted to Dr. Lorenso Courier service on Jul 14, 2004, for management of right  mid abdominal pain, not responding to therapy.  The patient states the pain  began about three weeks ago.  He was seen at [facility] on June 23, 2004.  He had  unenhanced abdominal CT scan.  There was no evidence of renal stones.  Suggested right pleural thickening.  He was treated with antibiotics, but  did not feel any better.  He was subsequently seen by Dr. Renard Matter and had  chest x-ray on Jul 01, 2004, which revealed atelectasis involving right lower  lobe.  He has continued to have pain which is more or less constant, but at  times intense and very severe.  He has chronic low back pain.  He has been  on Lortab up to four to six pills a day.  He states the pain medication  helps his back, but it has not helped with the side pain.  He has had  sporadic nausea with vomiting.  He also complains of feeling fatigued and  tired and does not have a good appetite, although he has not lost any  weight.  He denies frequent heartburn, dysphagia, melena, hematemesis, or  rectal bleeding.  He does have irregular bowel movements.  He has diarrhea,  and on some days he may have three or four bowel movements within a couple  of hours of waking up.  He states that the pain is worse when he coughs.  This is what he noted the very first time.  He denies dyspnea or shortness  of breath.   MEDICATIONS:  At home, he has been on Lortab, question dose, one  q.4h.  p.r.n.  He takes four to six doses a day.   CURRENT MEDICATIONS:  1.  He is presently on Lovenox 40 mg subcutaneously q.24h.  2.  Dilaudid 1 mg q.4h. p.r.n.  3.  Zofran 8 mg IV q.8h. p.r.n.  4.  Darvocet-N 100 one q.4h. p.r.n.   PAST MEDICAL HISTORY:  1.  Chronic low back pain.  L5-S1 laminectomy in January 2004.  2.  History of hypertension.  3.  Depression, presently no therapy.  4.  The patient was hospitalized in February 2005, for right-sided abdominal      pain.  He recalls that this pain was located inferior to the pain that      he has now.  At that time, he was having some hematochezia.  He had CT      scan which suggested a thickened right colon.  He had a colonoscopy by      Dr. ___________on April 27, 2003, which within normal limits.  During the      admission, he also had hepatobiliary scan with EF of 70%.   ALLERGIES:  No known drug allergies.   FAMILY HISTORY:  Noncontributory.  Father died of metastatic lung carcinoma.   SOCIAL HISTORY:  He is married, he has one daughter.  He was in [employer]  work, but now he is unable to work and has applied for disability.  He  smokes about a pack a day which he has smoked for over 15 years.  He does  not drink alcohol.   PHYSICAL EXAMINATION:  GENERAL:  A pleasant, well-developed, mildly obese  Caucasian male who appears to be in no acute distress.  VITAL SIGNS:  He weighs 212 pounds, he is 6 feet 1 inch tall.  Pulse is 73  per minute, blood pressure 133/90, temperature is 98.1, and respirations are  20.  HEENT:  Conjunctivae is pink, sclerae is nonicteric.  Oropharyngeal mucosa  is normal.  He has a few teeth in the lower jaw.  NECK:  No neck masses are noted.  CARDIAC:  Regular rate, normal S1 and S2.  No murmurs, rubs, or gallops  noted.  LUNGS:  Clear to auscultation.  BACK:  No abnormality noted to his back other than a small scar over the  lumbosacral area midline.  ABDOMEN:  Protuberant, bowel sounds are  normal, no bruits noted.  Abdomen is  soft with mild tenderness in mid right abdomen near the umbilicus.  No  organomegaly or masses noted.  RECTAL:  Deferred.  EXTREMITIES:  He does not have clubbing or peripheral edema, but he has  scaly erythematous rash to his arms.  He recalls that this started over a  year ago soon after his last hospitalization in February 2005.   LABORATORY DATA:  WBC 10, H&H 15.3 and 44.5, platelet count is 303,000, diff  is normal.  Sodium 135, potassium 3.5, chloride 98, CO2 is 28, glucose 89,  BUN 9, creatinine 0.9, calcium is 8.4.   Dr. Rito Ehrlich has seen the patient and his urinalysis is pending.  He does  not feel that this is renal colic.   He had ultrasound on Jul 03, 2004, which revealed sludge in the gallbladder,  otherwise normal study.   ASSESSMENT:  Rocky Link is a 46 year old Caucasian male who presents with a three  week history of right mid abdominal pain, posterolaterally.  He has had  sporadic nausea and vomiting.  Ultrasound is negative for cholelithiasis.  His CT scan was negative for renal stones.  It suggested pleural thickening  and therapy with antibiotic did not help.  He does have irregular bowel  movements with some diarrhea.  It is possible that this pain may be due to  irritable bowel syndrome, although it is located on the right side.  It is  interested though that he takes Lortab all the time and it had not helped  his pain.  It is likely that this pain is neuropathic or neuralgia.  He does  have a scaly rash to his palms.  I suspect that this is an eczema and  probably not indicative of vasculitis, etc.  I feel this needs to be  evaluated by a dermatologist at some point.   RECOMMENDATIONS:  1.  Trial with dicyclomine at 20 mg before each meal.  2.  Liver function tests will be obtained.  3.  Abdomino-pelvic CT scan with oral and intravenous contrast.  We would like to thank Dr. Renard Matter for the opportunity to participate in the   care of this gentleman.      NR/MEDQ  D:  07/16/2004  T:  07/16/2004  Job:  [id number]

## 2010-07-14 NOTE — H&P (Signed)
NAME:  Tim Schwartz, Tim Schwartz                         ACCOUNT NO.:  [id number]   MEDICAL RECORD NO.:  [id number]                   PATIENT TYPE:  INP   LOCATION:  NA                                   FACILITY:  [facility]   PHYSICIAN:  Payton Doughty, M.D.                   DATE OF BIRTH:  [birth year]   DATE OF ADMISSION:  03/10/2002  DATE OF DISCHARGE:                                HISTORY & PHYSICAL   ADMITTING DIAGNOSIS:  Herniated disk L5-S1 on the right.   HISTORY:  This is a 45 year old right-handed white gentleman who has had a  long history of low back pain and lower thoracic pain.  He has had low back  pain with pain down to both buttocks and down both legs in the right.  The  symptoms are unchanged.  An MRI demonstrated herniated disk concentric to  the right at L5-S1.  He underwent epidural steroids with an increase in pain  and he has straight leg raise that is positive on the back and in the right  side, and he is now admitted for a lumbar diskectomy.   PAST MEDICAL HISTORY:  Significant for hypertension and depression.   PAST SURGICAL HISTORY:  None.   MEDICATIONS:  1. Norvasc.  2. Diovan.  3. Xanax.  4. Effexor.  5. Lortab.   SOCIAL HISTORY:  He smokes a pack of cigarettes a day.  He does not drink  alcohol.  He is not working.   REVIEW OF SYSTEMS:  Remarkable for back and leg pain.   PHYSICAL EXAMINATION:  HEENT:  Within normal limits.  NECK:  He has regional range of motion of his neck.  CHEST:  Clear.  CARDIAC:  Regular rate and rhythm.  ABDOMEN:  Nontender with no hepatosplenomegaly.  EXTREMITIES:  Without clubbing or cyanosis.  Peripheral pulses are good.  GU:  Deferred.  NEUROLOGIC:  He is awake, alert, and oriented.  His cranial nerves are  intact.  Motor exam demonstrates 5/5 strength throughout the upper and lower  extremities.  There is no current sensory deficit.  Reflexes are 2 at the  knees, 2 at the ankles, toes are downgoing bilaterally.  Straight leg  raise  on the right is exquisitely positive for right leg pain.   An MRI demonstrated a herniated disk at 5-1 concentric to the right.   CLINICAL IMPRESSION:  Right S1 radiculopathy related to herniated disk.  He  has failed conservative measures.    PLAN:  Lumbar laminectomy and diskectomy.  The risks and benefits of this  approach have been discussed with him and he wishes to proceed.  Payton Doughty, M.D.    MWR/MEDQ  D:  03/10/2002  T:  03/10/2002  Job:  [id number]

## 2012-11-06 ENCOUNTER — Emergency Department (HOSPITAL_COMMUNITY)
Admission: EM | Admit: 2012-11-06 | Discharge: 2012-11-06 | Disposition: A | Discharge status: Home / Self Care | Payer: Medicaid Other | Attending: Emergency Medicine | Admitting: Emergency Medicine

## 2012-11-06 ENCOUNTER — Emergency Department (HOSPITAL_COMMUNITY): Payer: Medicaid Other

## 2012-11-06 ENCOUNTER — Encounter (HOSPITAL_COMMUNITY): Payer: Self-pay | Admitting: Emergency Medicine

## 2012-11-06 DIAGNOSIS — Z8639 Personal history of other endocrine, nutritional and metabolic disease: Secondary | ICD-10-CM | POA: Insufficient documentation

## 2012-11-06 DIAGNOSIS — F17200 Nicotine dependence, unspecified, uncomplicated: Secondary | ICD-10-CM | POA: Insufficient documentation

## 2012-11-06 DIAGNOSIS — R110 Nausea: Secondary | ICD-10-CM | POA: Insufficient documentation

## 2012-11-06 DIAGNOSIS — G8929 Other chronic pain: Secondary | ICD-10-CM | POA: Insufficient documentation

## 2012-11-06 DIAGNOSIS — Z87442 Personal history of urinary calculi: Secondary | ICD-10-CM | POA: Insufficient documentation

## 2012-11-06 DIAGNOSIS — R6883 Chills (without fever): Secondary | ICD-10-CM | POA: Insufficient documentation

## 2012-11-06 DIAGNOSIS — I10 Essential (primary) hypertension: Secondary | ICD-10-CM | POA: Insufficient documentation

## 2012-11-06 DIAGNOSIS — R1031 Right lower quadrant pain: Secondary | ICD-10-CM

## 2012-11-06 DIAGNOSIS — Z862 Personal history of diseases of the blood and blood-forming organs and certain disorders involving the immune mechanism: Secondary | ICD-10-CM | POA: Insufficient documentation

## 2012-11-06 HISTORY — DX: Other chronic pain: G89.29

## 2012-11-06 HISTORY — DX: Essential (primary) hypertension: I10

## 2012-11-06 HISTORY — DX: Pure hypercholesterolemia, unspecified: E78.00

## 2012-11-06 HISTORY — DX: Dorsalgia, unspecified: M54.9

## 2012-11-06 LAB — CBC WITH DIFFERENTIAL/PLATELET
Basophils Absolute: 0 10*3/uL (ref 0.0–0.1)
HCT: 44 % (ref 39.0–52.0)
Hemoglobin: 15.1 g/dL (ref 13.0–17.0)
Lymphocytes Relative: 38 % (ref 12–46)
Lymphs Abs: 3.1 10*3/uL (ref 0.7–4.0)
Monocytes Absolute: 0.5 10*3/uL (ref 0.1–1.0)
Monocytes Relative: 6 % (ref 3–12)
Neutro Abs: 4.3 10*3/uL (ref 1.7–7.7)
WBC: 8 10*3/uL (ref 4.0–10.5)

## 2012-11-06 LAB — COMPREHENSIVE METABOLIC PANEL
AST: 11 U/L (ref 0–37)
BUN: 8 mg/dL (ref 6–23)
CO2: 30 mEq/L (ref 19–32)
Chloride: 101 mEq/L (ref 96–112)
Creatinine, Ser: 0.8 mg/dL (ref 0.50–1.35)
GFR calc non Af Amer: >90 mL/min (ref >90)
Total Bilirubin: 0.2 mg/dL — ABNORMAL LOW (ref 0.3–1.2)

## 2012-11-06 LAB — URINALYSIS, ROUTINE W REFLEX MICROSCOPIC
Ketones, ur: NEGATIVE mg/dL
Leukocytes, UA: NEGATIVE
Nitrite: NEGATIVE
Protein, ur: NEGATIVE mg/dL
pH: 6 (ref 5.0–8.0)

## 2012-11-06 LAB — URINE MICROSCOPIC-ADD ON

## 2012-11-06 MED ORDER — MORPHINE SULFATE 4 MG/ML IJ SOLN
4.0000 mg | Freq: Once | INTRAMUSCULAR | Status: AC
  Administered 2012-11-06: 4 mg via INTRAVENOUS
  Filled 2012-11-06: qty 1

## 2012-11-06 NOTE — ED Provider Notes (Signed)
CSN: [id number]     Arrival date & time 11/06/12  1913 History  This chart was scribed for Geoffery Lyons, MD by Bennett Scrape, ED Scribe. This patient was seen in room APA03/APA03 and the patient's care was started at 8:02 PM.   Chief Complaint  Patient presents with  . Abdominal Pain    The history is provided by the patient. No language interpreter was used.    HPI Comments: Tim Schwartz is a 47 y.o. male who presents to the Emergency Department complaining of one month of intermittent RLQ pain. He states that the pain started off as a soreness that has gradually worsened to a constant sharp pain within the past week. He reports that he is here tonight due to the development of nausea 2 days ago. He states that he has f/u with his PCP and was told that the pain was coming from a "nerve that got messed up" during his back surgery. He reports that he is on a "pain patch" for his chronic back pain and admits that it is "hard to tell" the difference. He reports one associated symptoms with a "stomach bug" within the past week. He denies having any h/o prior abdominal surgeries. He states that he has a h/o kidney stones but denies similarities. He denies any known fevers as an associated symptom.  Past Medical History  Diagnosis Date  . Chronic back pain   . Hypertension   . High cholesterol    Past Surgical History  Procedure Laterality Date  . Back surgery     No family history on file. History  Substance Use Topics  . Smoking status: Current Every Day Smoker  . Smokeless tobacco: Not on file  . Alcohol Use: No    Review of Systems  Constitutional: Positive for chills. Negative for fever.  Gastrointestinal: Positive for nausea and abdominal pain. Negative for vomiting and diarrhea.  All other systems reviewed and are negative.    Allergies  Review of patient's allergies indicates no known allergies.  Home Medications  No current outpatient prescriptions on  file.  Triage Vitals: BP 116/74  Pulse 112  Temp(Src) 98.5 F (36.9 C) (Oral)  Resp 18  Ht 6\' 1"  (1.854 m)  Wt 210 lb (95.255 kg)  BMI 27.71 kg/m2  SpO2 99%  Physical Exam  Nursing note and vitals reviewed. Constitutional: He is oriented to person, place, and time. He appears well-developed and well-nourished. No distress.  HENT:  Head: Normocephalic and atraumatic.  Eyes: Conjunctivae and EOM are normal.  Neck: Normal range of motion. Neck supple. No tracheal deviation present.  Cardiovascular: Normal rate, regular rhythm and normal heart sounds.   No murmur heard. Pulmonary/Chest: Effort normal and breath sounds normal. No respiratory distress. He has no wheezes. He has no rales.  Abdominal: Soft. Bowel sounds are normal. There is tenderness (RLQ). There is no rebound and no guarding.  Musculoskeletal: Normal range of motion. He exhibits no edema.  Neurological: He is alert and oriented to person, place, and time. No cranial nerve deficit. He exhibits normal muscle tone. Coordination normal.  Skin: Skin is warm and dry.  Psychiatric: He has a normal mood and affect. His behavior is normal.    ED Course  Procedures (including critical care time)  DIAGNOSTIC STUDIES: Oxygen Saturation is 99% on room air, normal by my interpretation.    COORDINATION OF CARE: 8:08 PM-Discussed treatment plan which includes CT of abdomen with pt at bedside and pt agreed to plan.  Labs Review Labs Reviewed - No data to display Imaging Review No results found.  MDM  No diagnosis found. This patient is a 47 year old male with past medical history significant for chronic back pain for which he is on a fentanyl patch. Who presents to the emergency department today with complaints of pain in the right lower quadrant which has been there for several weeks. The pain has come and gone over the past week has worsened. He denies any nausea or vomiting. He denies any fevers or chills. Workup revealed  red blood cells in the urine which made me think this was potentially a kidney stone. He had no white count to suggest appendicitis and electrolyte were unremarkable. For this is in, a CT of the abdomen and pelvis without contrast was obtained. This showed mild appendiceal thickening with mild periappendiceal haziness, possibly suggestive of a early appendicitis. I discussed these findings with Dr. Lovell Sheehan who is the surgeon on call. With no fever and no white count with the CT findings, he felt as though discharge with a recheck in the morning would be appropriate. Patient appears to be comfortable with this states that he will return in the morning to be rechecked. If he continues to have pain or develops a fever or white count, Dr. Lovell Sheehan will likely perform an appendectomy in the morning.    I personally performed the services described in this documentation, which was scribed in my presence. The recorded information has been reviewed and is accurate.       Geoffery Lyons, MD 11/06/12 [phone]

## 2012-11-06 NOTE — ED Notes (Signed)
Patient c/o right lower quadrant pain x 1 month.  Patient states he had some nausea on Tuesday, but denies any other time.  Patient states pain is there all the time, but at times is sharper than other times.

## 2012-11-07 ENCOUNTER — Emergency Department (HOSPITAL_COMMUNITY)
Admission: EM | Admit: 2012-11-07 | Discharge: 2012-11-07 | Disposition: A | Discharge status: Home / Self Care | Payer: Medicaid Other | Attending: Emergency Medicine | Admitting: Emergency Medicine

## 2012-11-07 ENCOUNTER — Encounter (HOSPITAL_COMMUNITY): Payer: Self-pay | Admitting: *Deleted

## 2012-11-07 DIAGNOSIS — E7800 Pure hypercholesterolemia, unspecified: Secondary | ICD-10-CM | POA: Insufficient documentation

## 2012-11-07 DIAGNOSIS — R109 Unspecified abdominal pain: Secondary | ICD-10-CM

## 2012-11-07 DIAGNOSIS — R112 Nausea with vomiting, unspecified: Secondary | ICD-10-CM | POA: Insufficient documentation

## 2012-11-07 DIAGNOSIS — G8929 Other chronic pain: Secondary | ICD-10-CM | POA: Insufficient documentation

## 2012-11-07 DIAGNOSIS — Z7982 Long term (current) use of aspirin: Secondary | ICD-10-CM | POA: Insufficient documentation

## 2012-11-07 DIAGNOSIS — R1031 Right lower quadrant pain: Secondary | ICD-10-CM | POA: Insufficient documentation

## 2012-11-07 DIAGNOSIS — I10 Essential (primary) hypertension: Secondary | ICD-10-CM | POA: Insufficient documentation

## 2012-11-07 DIAGNOSIS — Z79899 Other long term (current) drug therapy: Secondary | ICD-10-CM | POA: Insufficient documentation

## 2012-11-07 DIAGNOSIS — F17200 Nicotine dependence, unspecified, uncomplicated: Secondary | ICD-10-CM | POA: Insufficient documentation

## 2012-11-07 LAB — BASIC METABOLIC PANEL
CO2: 28 mEq/L (ref 19–32)
Calcium: 9.1 mg/dL (ref 8.4–10.5)
Creatinine, Ser: 0.79 mg/dL (ref 0.50–1.35)
GFR calc non Af Amer: >90 mL/min (ref >90)
Sodium: 139 mEq/L (ref 135–145)

## 2012-11-07 LAB — CBC WITH DIFFERENTIAL/PLATELET
Lymphs Abs: 3 10*3/uL (ref 0.7–4.0)
MCH: 32.2 pg (ref 26.0–34.0)
MCHC: 33.6 g/dL (ref 30.0–36.0)
MCV: 95.8 fL (ref 78.0–100.0)
Monocytes Absolute: 0.5 10*3/uL (ref 0.1–1.0)
Monocytes Relative: 6 % (ref 3–12)
Neutro Abs: 5.3 10*3/uL (ref 1.7–7.7)
Neutrophils Relative %: 60 % (ref 43–77)
Platelets: 200 10*3/uL (ref 150–400)
RDW: 12.9 % (ref 11.5–15.5)

## 2012-11-07 MED ORDER — HYDROMORPHONE HCL PF 1 MG/ML IJ SOLN
0.5000 mg | Freq: Once | INTRAMUSCULAR | Status: AC
  Administered 2012-11-07: 0.5 mg via INTRAVENOUS
  Filled 2012-11-07: qty 1

## 2012-11-07 NOTE — ED Notes (Signed)
Pt here for recheck - states was seen last night for RLQ pain; reports this pain x 3 weeks. C/o nausea and pain; denies v/d.

## 2012-11-07 NOTE — Consult Note (Signed)
Reason for Consult: Right lower quadrant abdominal pain Referring Physician: ER  Tim Schwartz is an 47 y.o. male.  HPI: Patient is a 47 year old white male with a history of chronic back pain who presents with right lower quadrant abdominal pain. Apparently, he has been present intermittently over the past few weeks. He has a history of kidney stones. A CT scan the abdomen and pelvis was performed which revealed haziness around the appendix, possibly early appendicitis, though clinical correlation was suggested. He states this pain has been intermittent in nature. He denies any nausea or decreased appetite. No fevers have been noted. His white blood cell count was negative yesterday. He came back today for a followup examination as he was sent home last night with the idea of repeating his physical exam in the morning. Today, he has had no worsening right lower corner abdominal pain. No nausea or vomiting have been noted. No fevers have been noted.  Past Medical History  Diagnosis Date  . Chronic back pain   . Hypertension   . High cholesterol     Past Surgical History  Procedure Laterality Date  . Back surgery      No family history on file.  Social History:  reports that he has been smoking.  He does not have any smokeless tobacco history on file. He reports that he does not drink alcohol or use illicit drugs.  Allergies: No Known Allergies  Medications: I have reviewed the patient's current medications.  Results for orders placed during the hospital encounter of 11/07/12 (from the past 48 hour(s))  CBC WITH DIFFERENTIAL     Status: None   Collection Time    11/07/12 11:21 AM      Result Value Range   WBC 9.0  4.0 - 10.5 K/uL   RBC 4.50  4.22 - 5.81 MIL/uL   Hemoglobin 14.5  13.0 - 17.0 g/dL   HCT 52.8  41.3 - 24.4 %   MCV 95.8  78.0 - 100.0 fL   MCH 32.2  26.0 - 34.0 pg   MCHC 33.6  30.0 - 36.0 g/dL   RDW 01.0  27.2 - 53.6 %   Platelets 200  150 - 400 K/uL   Neutrophils  Relative % 60  43 - 77 %   Neutro Abs 5.3  1.7 - 7.7 K/uL   Lymphocytes Relative 33  12 - 46 %   Lymphs Abs 3.0  0.7 - 4.0 K/uL   Monocytes Relative 6  3 - 12 %   Monocytes Absolute 0.5  0.1 - 1.0 K/uL   Eosinophils Relative 1  0 - 5 %   Eosinophils Absolute 0.1  0.0 - 0.7 K/uL   Basophils Relative 0  0 - 1 %   Basophils Absolute 0.0  0.0 - 0.1 K/uL  BASIC METABOLIC PANEL     Status: None   Collection Time    11/07/12 11:21 AM      Result Value Range   Sodium 139  135 - 145 mEq/L   Potassium 3.7  3.5 - 5.1 mEq/L   Chloride 103  96 - 112 mEq/L   CO2 28  19 - 32 mEq/L   Glucose, Bld 94  70 - 99 mg/dL   BUN 11  6 - 23 mg/dL   Creatinine, Ser 6.44  0.50 - 1.35 mg/dL   Calcium 9.1  8.4 - 03.4 mg/dL   GFR calc non Af Amer >90  >90 mL/min   GFR calc Af  Amer >90  >90 mL/min   Comment: (NOTE)     The eGFR has been calculated using the CKD EPI equation.     This calculation has not been validated in all clinical situations.     eGFR's persistently <90 mL/min signify possible Chronic Kidney     Disease.    Ct Abdomen Pelvis Wo Contrast  11/06/2012   *RADIOLOGY REPORT*  Clinical Data: Right lower quadrant and flank pain  CT ABDOMEN AND PELVIS WITHOUT CONTRAST  Technique:  Multidetector CT imaging of the abdomen and pelvis was performed following the standard protocol without intravenous contrast.  Comparison: 10/25/2004  Findings: The lung bases are clear.  No pleural or pericardial effusion identified.  There is no focal liver abnormality.  The gallbladder is normal. No biliary dilatation.  Normal appearance of the pancreas.  The spleen is negative.  The adrenal glands are both normal.  The right kidney appears normal.  The left kidney is also normal.  No nephrolithiasis or obstructive uropathy identified.  There is no free fluid or fluid collection identified within the abdomen or pelvis.  The stomach appears normal.  The small bowel loops are within normal limits.  The appendix is mildly  thickened measuring 10 mm in diameter.  There is mild appendiceal wall edema and very subtle peri appendiceal haziness.  No perforation or abscess identified.  No significant free fluid.  The colon is unremarkable.  Review of the visualized bony structures is unremarkable.  IMPRESSION:  1. Mild appendiceal thickening with mild peri appendiceal haziness. Cannot rule out early acute appendicitis.  Careful clinical correlation is advised. 2.  No complicating features identified.  Specifically, there is no evidence for free fluid or abscess formation.   Original Report Authenticated By: Tim Schwartz, M.D.    ROS: See chart Blood pressure 115/80, pulse 103, temperature 98.1 F (36.7 C), temperature source Oral, resp. rate 16, height 6\' 1"  (1.854 m), weight 95.255 kg (210 lb), SpO2 96.00%. Physical Exam: Pleasant white male no acute distress. Abdomen: Soft, nondistended. No rigidity noted. No pain over McBurney's point. He points to an area just above the iliac crest as the source of tenderness, though it is not necessarily reproducible. He is easily distracted during the examination.  Assessment/Plan: Impression: Abdominal pain of unknown etiology, acute appendicitis less likely. In addition, he gives a history of a relative who had a GI bug earlier this week. I do not feel he needs to go to the operating room at this time. Should his pain not resolve or worsen over the weekend, he was told to call me and I would consider doing a diagnostic laparoscopy, appendectomy. He fully understands the treatment plan. This was discussed with him and his mother.  Tim Schwartz A 11/07/2012, 2:41 PM

## 2012-11-07 NOTE — ED Notes (Signed)
Left in c/o mother for transport home; instructions and f/u information given/reviewed - verbalizes understanding.

## 2012-11-07 NOTE — ED Provider Notes (Signed)
CSN: [id number]     Arrival date & time 11/07/12  0944 History  This chart was scribed for Tim Lennert, MD by Quintella Reichert, ED scribe.  This patient was seen in room APA02/APA02 and the patient's care was started at 11:07 AM.  Chief Complaint  Patient presents with  . Abdominal Pain    Patient is a 47 y.o. male presenting with abdominal pain. The history is provided by the patient. No language interpreter was used.  Abdominal Pain Pain location:  RLQ Pain quality: dull and sharp   Pain severity now: moderate-to-severe. Duration:  3 weeks Timing:  Intermittent Progression:  Worsening Chronicity:  New Relieved by:  Nothing Associated symptoms: nausea and vomiting (one episode several days ago)   Associated symptoms: no diarrhea and no fever     HPI Comments: Tim Schwartz is a 47 y.o. male who presents to the Emergency Department complaining of intermittent moderate-to-severe RLQ abdominal pain that began 3 weeks ago but has grown more severe over the past week.  Pt states pain is mostly dull but occasionally becomes sharp. He also complains of nausea that began several days ago and states that he vomited 3 days ago.  He denies fever.  He was seen in the ED last night for the same and received a CT scan which suggested possible early appendicitis and the surgeon on call (Dr. Lovell Sheehan) recommended discharge with a recheck in the morning. Pt states his pain has not changed since then.    Past Medical History  Diagnosis Date  . Chronic back pain   . Hypertension   . High cholesterol     Past Surgical History  Procedure Laterality Date  . Back surgery      No family history on file.   History  Substance Use Topics  . Smoking status: Current Every Day Smoker  . Smokeless tobacco: Not on file  . Alcohol Use: No     Review of Systems  Constitutional: Negative for fever.  Gastrointestinal: Positive for nausea, vomiting (one episode several days ago) and abdominal  pain. Negative for diarrhea.  All other systems reviewed and are negative.     Allergies  Review of patient's allergies indicates no known allergies.  Home Medications   Current Outpatient Rx  Name  Route  Sig  Dispense  Refill  . ALPRAZolam (XANAX) 1 MG tablet   Oral   Take 1-2 mg by mouth 2 (two) times daily. Patient takes 1 tablet in the morning and 2 tablets at bedtime         . cyclobenzaprine (FLEXERIL) 10 MG tablet   Oral   Take 10 mg by mouth 3 (three) times daily as needed for muscle spasms.         . halobetasol (ULTRAVATE) 0.05 % ointment   Topical   Apply 1 application topically 2 (two) times daily.         Marland Kitchen ibuprofen (ADVIL,MOTRIN) 800 MG tablet   Oral   Take 800 mg by mouth daily as needed for pain.          Marland Kitchen PRESCRIPTION MEDICATION   Topical   Apply 1 application topically at bedtime. salacid 50% petroleum jelly         . zolpidem (AMBIEN) 10 MG tablet   Oral   Take 10 mg by mouth at bedtime as needed for sleep.         Marland Kitchen aspirin EC 81 MG tablet   Oral   Take  81 mg by mouth daily.         . simvastatin (ZOCOR) 10 MG tablet   Oral   Take 10 mg by mouth at bedtime.          BP 115/80  Pulse 103  Temp(Src) 98.1 F (36.7 C) (Oral)  Resp 16  Ht 6\' 1"  (1.854 m)  Wt 210 lb (95.255 kg)  BMI 27.71 kg/m2  SpO2 96%  Physical Exam  Vitals reviewed. Constitutional: He is oriented to person, place, and time. He appears well-developed.  HENT:  Head: Normocephalic.  Eyes: Conjunctivae and EOM are normal. No scleral icterus.  Neck: Neck supple. No thyromegaly present.  Cardiovascular: Normal rate and regular rhythm.  Exam reveals no gallop and no friction rub.   No murmur heard. Pulmonary/Chest: No stridor. He has no wheezes. He has no rales. He exhibits no tenderness.  Abdominal: Soft. He exhibits no distension. There is tenderness (mild, RLQ). There is no rebound.  Musculoskeletal: Normal range of motion. He exhibits no edema.   Lymphadenopathy:    He has no cervical adenopathy.  Neurological: He is oriented to person, place, and time. Coordination normal.  Skin: No rash noted. No erythema.  Psychiatric: He has a normal mood and affect. His behavior is normal.    ED Course  Procedures (including critical care time)  DIAGNOSTIC STUDIES: Oxygen Saturation is 96% on room air, normal by my interpretation.    COORDINATION OF CARE: 11:13 AM: Discussed treatment plan which includes pain medication and labs.  Pt expressed understanding and agreed to plan.   Labs Review Labs Reviewed  CBC WITH DIFFERENTIAL  BASIC METABOLIC PANEL    Imaging Review Ct Abdomen Pelvis Wo Contrast  11/06/2012   *RADIOLOGY REPORT*  Clinical Data: Right lower quadrant and flank pain  CT ABDOMEN AND PELVIS WITHOUT CONTRAST  Technique:  Multidetector CT imaging of the abdomen and pelvis was performed following the standard protocol without intravenous contrast.  Comparison: 10/25/2004  Findings: The lung bases are clear.  No pleural or pericardial effusion identified.  There is no focal liver abnormality.  The gallbladder is normal. No biliary dilatation.  Normal appearance of the pancreas.  The spleen is negative.  The adrenal glands are both normal.  The right kidney appears normal.  The left kidney is also normal.  No nephrolithiasis or obstructive uropathy identified.  There is no free fluid or fluid collection identified within the abdomen or pelvis.  The stomach appears normal.  The small bowel loops are within normal limits.  The appendix is mildly thickened measuring 10 mm in diameter.  There is mild appendiceal wall edema and very subtle peri appendiceal haziness.  No perforation or abscess identified.  No significant free fluid.  The colon is unremarkable.  Review of the visualized bony structures is unremarkable.  IMPRESSION:  1. Mild appendiceal thickening with mild peri appendiceal haziness. Cannot rule out early acute appendicitis.   Careful clinical correlation is advised. 2.  No complicating features identified.  Specifically, there is no evidence for free fluid or abscess formation.   Original Report Authenticated By: Signa Kell, M.D.    MDM  No diagnosis found.   The chart was scribed for me under my direct supervision.  I personally performed the history, physical, and medical decision making and all procedures in the evaluation of this patient.Tim Lennert, MD 11/07/12 1444

## 2013-07-15 ENCOUNTER — Other Ambulatory Visit (HOSPITAL_COMMUNITY): Payer: Self-pay | Admitting: Family Medicine

## 2013-07-15 ENCOUNTER — Ambulatory Visit (HOSPITAL_COMMUNITY)
Admission: RE | Admit: 2013-07-15 | Discharge: 2013-07-15 | Disposition: A | Discharge status: Home / Self Care | Payer: Medicaid Other | Source: Ambulatory Visit | Attending: Family Medicine | Admitting: Family Medicine

## 2013-07-15 DIAGNOSIS — M545 Low back pain, unspecified: Secondary | ICD-10-CM

## 2013-07-15 DIAGNOSIS — M5137 Other intervertebral disc degeneration, lumbosacral region: Secondary | ICD-10-CM | POA: Insufficient documentation

## 2013-07-15 DIAGNOSIS — M51379 Other intervertebral disc degeneration, lumbosacral region without mention of lumbar back pain or lower extremity pain: Secondary | ICD-10-CM | POA: Insufficient documentation

## 2014-12-29 ENCOUNTER — Ambulatory Visit (HOSPITAL_COMMUNITY)
Admission: RE | Admit: 2014-12-29 | Discharge: 2014-12-29 | Disposition: A | Discharge status: Home / Self Care | Payer: Medicaid Other | Source: Ambulatory Visit | Attending: Family Medicine | Admitting: Family Medicine

## 2014-12-29 ENCOUNTER — Other Ambulatory Visit (HOSPITAL_COMMUNITY): Payer: Self-pay | Admitting: Family Medicine

## 2014-12-29 DIAGNOSIS — M549 Dorsalgia, unspecified: Secondary | ICD-10-CM | POA: Diagnosis not present

## 2014-12-29 DIAGNOSIS — M5442 Lumbago with sciatica, left side: Secondary | ICD-10-CM | POA: Diagnosis not present

## 2015-09-21 ENCOUNTER — Encounter (HOSPITAL_COMMUNITY): Payer: Self-pay | Admitting: Emergency Medicine

## 2015-09-21 ENCOUNTER — Emergency Department (HOSPITAL_COMMUNITY)
Admission: EM | Admit: 2015-09-21 | Discharge: 2015-09-21 | Discharge status: Against Medical Advice | Payer: Medicaid Other | Attending: Emergency Medicine | Admitting: Emergency Medicine

## 2015-09-21 ENCOUNTER — Emergency Department (HOSPITAL_COMMUNITY): Payer: Medicaid Other

## 2015-09-21 DIAGNOSIS — F17200 Nicotine dependence, unspecified, uncomplicated: Secondary | ICD-10-CM | POA: Diagnosis not present

## 2015-09-21 DIAGNOSIS — Z79899 Other long term (current) drug therapy: Secondary | ICD-10-CM | POA: Diagnosis not present

## 2015-09-21 DIAGNOSIS — I959 Hypotension, unspecified: Secondary | ICD-10-CM | POA: Diagnosis not present

## 2015-09-21 DIAGNOSIS — Z7982 Long term (current) use of aspirin: Secondary | ICD-10-CM | POA: Insufficient documentation

## 2015-09-21 DIAGNOSIS — Z791 Long term (current) use of non-steroidal anti-inflammatories (NSAID): Secondary | ICD-10-CM | POA: Diagnosis not present

## 2015-09-21 LAB — CBC WITH DIFFERENTIAL/PLATELET
BASOS ABS: 0 10*3/uL (ref 0.0–0.1)
BASOS PCT: 0 %
EOS ABS: 0.1 10*3/uL (ref 0.0–0.7)
EOS PCT: 1 %
HCT: 46.8 % (ref 39.0–52.0)
Hemoglobin: 15.1 g/dL (ref 13.0–17.0)
LYMPHS PCT: 34 %
Lymphs Abs: 4.7 10*3/uL — ABNORMAL HIGH (ref 0.7–4.0)
MCH: 32.6 pg (ref 26.0–34.0)
MCHC: 32.3 g/dL (ref 30.0–36.0)
MCV: 101.1 fL — AB (ref 78.0–100.0)
MONO ABS: 0.7 10*3/uL (ref 0.1–1.0)
Monocytes Relative: 5 %
Neutro Abs: 8.4 10*3/uL — ABNORMAL HIGH (ref 1.7–7.7)
Neutrophils Relative %: 60 %
PLATELETS: 193 10*3/uL (ref 150–400)
RBC: 4.63 MIL/uL (ref 4.22–5.81)
RDW: 14.7 % (ref 11.5–15.5)
WBC: 13.9 10*3/uL — ABNORMAL HIGH (ref 4.0–10.5)

## 2015-09-21 LAB — COMPREHENSIVE METABOLIC PANEL
ALT: 19 U/L (ref 17–63)
ANION GAP: 12 (ref 5–15)
AST: 19 U/L (ref 15–41)
Albumin: 3.8 g/dL (ref 3.5–5.0)
Alkaline Phosphatase: 92 U/L (ref 38–126)
BILIRUBIN TOTAL: 0.6 mg/dL (ref 0.3–1.2)
BUN: 16 mg/dL (ref 6–20)
CALCIUM: 8.2 mg/dL — AB (ref 8.9–10.3)
CHLORIDE: 98 mmol/L — AB (ref 101–111)
CO2: 28 mmol/L (ref 22–32)
CREATININE: 1.91 mg/dL — AB (ref 0.61–1.24)
GFR calc Af Amer: 45 mL/min — ABNORMAL LOW (ref >60)
GFR, EST NON AFRICAN AMERICAN: 39 mL/min — AB (ref >60)
GLUCOSE: 81 mg/dL (ref 65–99)
POTASSIUM: 3 mmol/L — AB (ref 3.5–5.1)
Sodium: 138 mmol/L (ref 135–145)
TOTAL PROTEIN: 7.6 g/dL (ref 6.5–8.1)

## 2015-09-21 LAB — URINALYSIS, ROUTINE W REFLEX MICROSCOPIC
Glucose, UA: NEGATIVE mg/dL
LEUKOCYTES UA: NEGATIVE
NITRITE: NEGATIVE
PH: 5 (ref 5.0–8.0)
Specific Gravity, Urine: 1.025 (ref 1.005–1.030)

## 2015-09-21 LAB — I-STAT CHEM 8, ED
BUN: 16 mg/dL (ref 6–20)
CALCIUM ION: 1.05 mmol/L — AB (ref 1.13–1.30)
CHLORIDE: 99 mmol/L — AB (ref 101–111)
Creatinine, Ser: 1.8 mg/dL — ABNORMAL HIGH (ref 0.61–1.24)
Glucose, Bld: 80 mg/dL (ref 65–99)
HCT: 50 % (ref 39.0–52.0)
Hemoglobin: 17 g/dL (ref 13.0–17.0)
Potassium: 3.3 mmol/L — ABNORMAL LOW (ref 3.5–5.1)
SODIUM: 140 mmol/L (ref 135–145)
TCO2: 29 mmol/L (ref 0–100)

## 2015-09-21 LAB — URINE MICROSCOPIC-ADD ON

## 2015-09-21 LAB — I-STAT TROPONIN, ED: Troponin i, poc: 0 ng/mL (ref 0.00–0.08)

## 2015-09-21 LAB — I-STAT CG4 LACTIC ACID, ED: LACTIC ACID, VENOUS: 1.51 mmol/L (ref 0.5–1.9)

## 2015-09-21 LAB — POC OCCULT BLOOD, ED: FECAL OCCULT BLD: POSITIVE — AB

## 2015-09-21 MED ORDER — FAMOTIDINE IN NACL 20-0.9 MG/50ML-% IV SOLN
20.0000 mg | Freq: Once | INTRAVENOUS | Status: AC
  Administered 2015-09-21: 20 mg via INTRAVENOUS
  Filled 2015-09-21: qty 50

## 2015-09-21 MED ORDER — DEXTROSE 5 % IV SOLN
1.0000 g | Freq: Once | INTRAVENOUS | Status: AC
  Administered 2015-09-21: 1 g via INTRAVENOUS
  Filled 2015-09-21: qty 10

## 2015-09-21 MED ORDER — SODIUM CHLORIDE 0.9 % IV BOLUS (SEPSIS): 1000.0000 mL | Freq: Once | INTRAVENOUS | Status: DC

## 2015-09-21 MED ORDER — SODIUM CHLORIDE 0.9 % IV BOLUS (SEPSIS)
3000.0000 mL | Freq: Once | INTRAVENOUS | Status: AC
  Administered 2015-09-21: 3000 mL via INTRAVENOUS

## 2015-09-21 NOTE — ED Notes (Signed)
Patients stating is he leaving he is not going to be admitted. Patient took both IV's out himself. Dr. Estell Harpin at bedside tried to speak with patient, patient became argumenative. Patient ambulatory out of department with steady gait with family.

## 2015-09-21 NOTE — ED Notes (Signed)
Patient unable to void at this time. Urinal provided to patient and states he will let writer know when he is able to void.

## 2015-09-21 NOTE — ED Provider Notes (Signed)
[facility]Y DEPT Provider Note   CSN: [id number] Arrival date & time: 09/21/15  1805  First Provider Contact:  First MD Initiated Contact with Patient 09/21/15 1836        History   Chief Complaint Chief Complaint  Patient presents with  . Hypotension    HPI Tim Schwartz is a 50 y.o. male.  Patient states that he has been outside a lot and sweating a lot with his family doctor and they checked him and said he needed to come to the hospital because blood pressure was low    Weakness  This is a new problem. The current episode started 12 to 24 hours ago. The problem occurs constantly. The problem has not changed since onset.Pertinent negatives include no chest pain, no abdominal pain and no headaches. Nothing aggravates the symptoms. Nothing relieves the symptoms. He has tried nothing for the symptoms.    Past Medical History:  Diagnosis Date  . Chronic back pain   . High cholesterol   . Hypertension     There are no active problems to display for this patient.   Past Surgical History:  Procedure Laterality Date  . BACK SURGERY         Home Medications    Prior to Admission medications   Medication Sig Start Date End Date Taking? Authorizing Provider  ALPRAZolam Prudy Feeler) 1 MG tablet Take 1-2 mg by mouth 2 (two) times daily. Patient takes 1 tablet in the morning and 2 tablets at bedtime   Yes Historical Provider, MD  aspirin EC 81 MG tablet Take 81 mg by mouth daily.   Yes Historical Provider, MD  citalopram (CELEXA) 40 MG tablet Take 40 mg by mouth at bedtime. 09/01/15  Yes Historical Provider, MD  cyclobenzaprine (FLEXERIL) 10 MG tablet Take 10 mg by mouth 3 (three) times daily as needed for muscle spasms.   Yes Historical Provider, MD  halobetasol (ULTRAVATE) 0.05 % ointment Apply 1 application topically 2 (two) times daily.   Yes Historical Provider, MD  ibuprofen (ADVIL,MOTRIN) 800 MG tablet Take 800 mg by mouth daily as needed for pain.    Yes Historical  Provider, MD  PRESCRIPTION MEDICATION Apply 1 application topically daily as needed (breakout). salacid 50% petroleum jelly   Yes Historical Provider, MD  tamsulosin (FLOMAX) 0.4 MG CAPS capsule Take 0.4 mg by mouth daily. 08/04/15  Yes Historical Provider, MD  zolpidem (AMBIEN) 10 MG tablet Take 10 mg by mouth at bedtime as needed for sleep.   Yes Historical Provider, MD  fentaNYL (DURAGESIC - DOSED MCG/HR) 100 MCG/HR Place 1 patch onto the skin every 3 (three) days. 09/03/15   Historical Provider, MD    Family History History reviewed. No pertinent family history.  Social History Social History  Substance Use Topics  . Smoking status: Current Every Day Smoker  . Smokeless tobacco: Never Used  . Alcohol use No     Allergies   Review of patient's allergies indicates no known allergies.   Review of Systems Review of Systems  Constitutional: Negative for appetite change and fatigue.  HENT: Negative for congestion, ear discharge and sinus pressure.   Eyes: Negative for discharge.  Respiratory: Negative for cough.   Cardiovascular: Negative for chest pain.  Gastrointestinal: Negative for abdominal pain and diarrhea.  Genitourinary: Negative for frequency and hematuria.  Musculoskeletal: Negative for back pain.  Skin: Negative for rash.  Neurological: Positive for weakness. Negative for seizures and headaches.  Psychiatric/Behavioral: Negative for hallucinations.  Physical Exam Updated Vital Signs BP 94/56   Pulse 83   Temp 97.9 F (36.6 C) (Oral)   Resp 21   Ht 6\' 1"  (1.854 m)   Wt 190 lb (86.2 kg)   SpO2 98%   BMI 25.07 kg/m   Physical Exam  Constitutional: He is oriented to person, place, and time. He appears well-developed.  HENT:  Head: Normocephalic.  Eyes: Conjunctivae and EOM are normal. No scleral icterus.  Neck: Neck supple. No thyromegaly present.  Cardiovascular: Normal rate and regular rhythm.  Exam reveals no gallop and no friction rub.   No murmur  heard. Pulmonary/Chest: No stridor. He has no wheezes. He has no rales. He exhibits no tenderness.  Abdominal: He exhibits no distension. There is no tenderness. There is no rebound.  Musculoskeletal: Normal range of motion. He exhibits no edema.  Lymphadenopathy:    He has no cervical adenopathy.  Neurological: He is oriented to person, place, and time. He exhibits normal muscle tone. Coordination normal.  Skin: No rash noted. No erythema.  Psychiatric: He has a normal mood and affect. His behavior is normal.     ED Treatments / Results  Labs (all labs ordered are listed, but only abnormal results are displayed) Labs Reviewed  COMPREHENSIVE METABOLIC PANEL - Abnormal; Notable for the following:       Result Value   Potassium 3.0 (*)    Chloride 98 (*)    Creatinine, Ser 1.91 (*)    Calcium 8.2 (*)    GFR calc non Af Amer 39 (*)    GFR calc Af Amer 45 (*)    All other components within normal limits  CBC WITH DIFFERENTIAL/PLATELET - Abnormal; Notable for the following:    WBC 13.9 (*)    MCV 101.1 (*)    Neutro Abs 8.4 (*)    Lymphs Abs 4.7 (*)    All other components within normal limits  URINALYSIS, ROUTINE W REFLEX MICROSCOPIC (NOT AT [facility]) - Abnormal; Notable for the following:    Hgb urine dipstick MODERATE (*)    Bilirubin Urine SMALL (*)    Ketones, ur TRACE (*)    Protein, ur TRACE (*)    All other components within normal limits  URINE MICROSCOPIC-ADD ON - Abnormal; Notable for the following:    Squamous Epithelial / LPF 6-30 (*)    Bacteria, UA MANY (*)    Casts HYALINE CASTS (*)    All other components within normal limits  I-STAT CHEM 8, ED - Abnormal; Notable for the following:    Potassium 3.3 (*)    Chloride 99 (*)    Creatinine, Ser 1.80 (*)    Calcium, Ion 1.05 (*)    All other components within normal limits  POC OCCULT BLOOD, ED - Abnormal; Notable for the following:    Fecal Occult Bld POSITIVE (*)    All other components within normal limits    CULTURE, BLOOD (ROUTINE X 2)  CULTURE, BLOOD (ROUTINE X 2)  URINE CULTURE  I-STAT CG4 LACTIC ACID, ED  I-STAT TROPOININ, ED    EKG  EKG Interpretation None       Radiology Dg Chest Portable 1 View  Result Date: 09/21/2015 CLINICAL DATA:  Weakness EXAM: PORTABLE CHEST 1 VIEW COMPARISON:  None. FINDINGS: Lungs are clear.  No pleural effusion or pneumothorax. The heart is normal in size. IMPRESSION: No evidence of acute cardiopulmonary disease. Electronically Signed   By: Charline Bills M.D.   On: 09/21/2015 18:59  Procedures Procedures (including critical care time)  Medications Ordered in ED Medications  sodium chloride 0.9 % bolus 1,000 mL (not administered)  sodium chloride 0.9 % bolus 3,000 mL (0 mLs Intravenous Stopped 09/21/15 1945)  cefTRIAXone (ROCEPHIN) 1 g in dextrose 5 % 50 mL IVPB (0 g Intravenous Stopped 09/21/15 1945)  famotidine (PEPCID) IVPB 20 mg premix (0 mg Intravenous Stopped 09/21/15 1945)     Initial Impression / Assessment and Plan / ED Course  I have reviewed the triage vital signs and the nursing notes.  Pertinent labs & imaging results that were available during my care of the patient were reviewed by me and considered in my medical decision making (see chart for details).  Clinical Course  Patient also had his Duragesic patch removed  Patient's hypotension improved some with fluids labs suggest dehydration possible urinary tract infection urine culture done blood cultures done patient was told that he needed to be admitted to the hospital because he had low blood pressure may have a serious infection. He was told that it's possible he could die if he didn't stay in the hospital. Patient refused admission and signed out [facility]  Final Clinical Impressions(s) / ED Diagnoses   Final diagnoses:  None    New Prescriptions New Prescriptions   No medications on file     Bethann Berkshire, MD 09/21/15 2023

## 2015-09-21 NOTE — ED Notes (Signed)
AC here to assist as needed.

## 2015-09-21 NOTE — ED Triage Notes (Addendum)
Patient sent by PCP for blood pressure of 74/49 in office. Patient's B/P 74/41 in triage. Patient alert, oriented, and ambulatory at triage. States "I don't have running water so I haven't been drinking water like I should." States he has hypertension but has not taken his medication since "night before last."

## 2015-09-21 NOTE — ED Notes (Signed)
MD at bedside. 

## 2015-09-23 LAB — URINE CULTURE: Culture: NO GROWTH

## 2015-09-26 LAB — CULTURE, BLOOD (ROUTINE X 2)
CULTURE: NO GROWTH
CULTURE: NO GROWTH

## 2015-10-29 ENCOUNTER — Emergency Department (HOSPITAL_COMMUNITY): Payer: Medicaid Other

## 2015-10-29 ENCOUNTER — Inpatient Hospital Stay (HOSPITAL_COMMUNITY)
Admission: EM | Admit: 2015-10-29 | Discharge: 2015-10-31 | DRG: 896 | Disposition: A | Discharge status: Home / Self Care | Payer: Medicaid Other | Attending: Internal Medicine | Admitting: Internal Medicine

## 2015-10-29 ENCOUNTER — Encounter (HOSPITAL_COMMUNITY): Payer: Self-pay

## 2015-10-29 DIAGNOSIS — G8929 Other chronic pain: Secondary | ICD-10-CM | POA: Diagnosis present

## 2015-10-29 DIAGNOSIS — T402X6A Underdosing of other opioids, initial encounter: Secondary | ICD-10-CM | POA: Diagnosis present

## 2015-10-29 DIAGNOSIS — G92 Toxic encephalopathy: Secondary | ICD-10-CM | POA: Diagnosis present

## 2015-10-29 DIAGNOSIS — F411 Generalized anxiety disorder: Secondary | ICD-10-CM

## 2015-10-29 DIAGNOSIS — R4182 Altered mental status, unspecified: Secondary | ICD-10-CM

## 2015-10-29 DIAGNOSIS — I10 Essential (primary) hypertension: Secondary | ICD-10-CM | POA: Diagnosis present

## 2015-10-29 DIAGNOSIS — E7800 Pure hypercholesterolemia, unspecified: Secondary | ICD-10-CM | POA: Diagnosis present

## 2015-10-29 DIAGNOSIS — M549 Dorsalgia, unspecified: Secondary | ICD-10-CM | POA: Diagnosis present

## 2015-10-29 DIAGNOSIS — Z91128 Patient's intentional underdosing of medication regimen for other reason: Secondary | ICD-10-CM

## 2015-10-29 DIAGNOSIS — E876 Hypokalemia: Secondary | ICD-10-CM | POA: Diagnosis present

## 2015-10-29 DIAGNOSIS — F1123 Opioid dependence with withdrawal: Secondary | ICD-10-CM | POA: Diagnosis present

## 2015-10-29 DIAGNOSIS — R339 Retention of urine, unspecified: Secondary | ICD-10-CM | POA: Diagnosis present

## 2015-10-29 DIAGNOSIS — F17200 Nicotine dependence, unspecified, uncomplicated: Secondary | ICD-10-CM | POA: Diagnosis present

## 2015-10-29 DIAGNOSIS — E860 Dehydration: Secondary | ICD-10-CM | POA: Diagnosis present

## 2015-10-29 DIAGNOSIS — F1193 Opioid use, unspecified with withdrawal: Secondary | ICD-10-CM | POA: Diagnosis not present

## 2015-10-29 DIAGNOSIS — R410 Disorientation, unspecified: Secondary | ICD-10-CM | POA: Diagnosis not present

## 2015-10-29 DIAGNOSIS — E785 Hyperlipidemia, unspecified: Secondary | ICD-10-CM | POA: Diagnosis present

## 2015-10-29 DIAGNOSIS — Z823 Family history of stroke: Secondary | ICD-10-CM

## 2015-10-29 DIAGNOSIS — R262 Difficulty in walking, not elsewhere classified: Secondary | ICD-10-CM

## 2015-10-29 LAB — CREATININE, SERUM
Creatinine, Ser: 0.94 mg/dL (ref 0.61–1.24)
GFR calc Af Amer: >60 mL/min (ref >60)
GFR calc non Af Amer: >60 mL/min (ref >60)

## 2015-10-29 LAB — URINALYSIS, ROUTINE W REFLEX MICROSCOPIC
Glucose, UA: NEGATIVE mg/dL
Ketones, ur: 40 mg/dL — AB
Leukocytes, UA: NEGATIVE
Nitrite: NEGATIVE
PH: 6 (ref 5.0–8.0)
Protein, ur: 100 mg/dL — AB
SPECIFIC GRAVITY, URINE: 1.025 (ref 1.005–1.030)

## 2015-10-29 LAB — CBC WITH DIFFERENTIAL/PLATELET
Basophils Absolute: 0 10*3/uL (ref 0.0–0.1)
Basophils Relative: 0 %
EOS ABS: 0 10*3/uL (ref 0.0–0.7)
EOS PCT: 0 %
HCT: 52.2 % — ABNORMAL HIGH (ref 39.0–52.0)
HEMOGLOBIN: 18.7 g/dL — AB (ref 13.0–17.0)
LYMPHS ABS: 2.3 10*3/uL (ref 0.7–4.0)
Lymphocytes Relative: 14 %
MCH: 35.1 pg — AB (ref 26.0–34.0)
MCHC: 35.8 g/dL (ref 30.0–36.0)
MCV: 97.9 fL (ref 78.0–100.0)
MONO ABS: 0.9 10*3/uL (ref 0.1–1.0)
MONOS PCT: 6 %
NEUTROS PCT: 80 %
Neutro Abs: 12.8 10*3/uL — ABNORMAL HIGH (ref 1.7–7.7)
Platelets: 187 10*3/uL (ref 150–400)
RBC: 5.33 MIL/uL (ref 4.22–5.81)
RDW: 13.7 % (ref 11.5–15.5)
WBC: 16 10*3/uL — ABNORMAL HIGH (ref 4.0–10.5)

## 2015-10-29 LAB — BASIC METABOLIC PANEL
Anion gap: 15 (ref 5–15)
BUN: 37 mg/dL — AB (ref 6–20)
CALCIUM: 9.4 mg/dL (ref 8.9–10.3)
CHLORIDE: 93 mmol/L — AB (ref 101–111)
CO2: 28 mmol/L (ref 22–32)
CREATININE: 1.06 mg/dL (ref 0.61–1.24)
GFR calc Af Amer: >60 mL/min (ref >60)
GFR calc non Af Amer: >60 mL/min (ref >60)
Glucose, Bld: 116 mg/dL — ABNORMAL HIGH (ref 65–99)
Potassium: 3.1 mmol/L — ABNORMAL LOW (ref 3.5–5.1)
SODIUM: 136 mmol/L (ref 135–145)

## 2015-10-29 LAB — RAPID URINE DRUG SCREEN, HOSP PERFORMED
Amphetamines: NOT DETECTED
BARBITURATES: NOT DETECTED
Benzodiazepines: POSITIVE — AB
Cocaine: NOT DETECTED
OPIATES: NOT DETECTED
TETRAHYDROCANNABINOL: NOT DETECTED

## 2015-10-29 LAB — PROTIME-INR
INR: 1.02
Prothrombin Time: 13.4 seconds (ref 11.4–15.2)

## 2015-10-29 LAB — URINE MICROSCOPIC-ADD ON

## 2015-10-29 LAB — HEPATIC FUNCTION PANEL
ALK PHOS: 84 U/L (ref 38–126)
ALT: 22 U/L (ref 17–63)
AST: 26 U/L (ref 15–41)
Albumin: 4.6 g/dL (ref 3.5–5.0)
BILIRUBIN INDIRECT: 1.2 mg/dL — AB (ref 0.3–0.9)
BILIRUBIN TOTAL: 1.4 mg/dL — AB (ref 0.3–1.2)
Bilirubin, Direct: 0.2 mg/dL (ref 0.1–0.5)
TOTAL PROTEIN: 9.3 g/dL — AB (ref 6.5–8.1)

## 2015-10-29 LAB — TROPONIN I

## 2015-10-29 LAB — AMMONIA: Ammonia: <9 umol/L — ABNORMAL LOW (ref 9–35)

## 2015-10-29 LAB — LACTIC ACID, PLASMA: LACTIC ACID, VENOUS: 1.7 mmol/L (ref 0.5–1.9)

## 2015-10-29 LAB — ETHANOL: ALCOHOL ETHYL (B): 9 mg/dL — AB (ref <5)

## 2015-10-29 LAB — TSH: TSH: 1.943 u[IU]/mL (ref 0.350–4.500)

## 2015-10-29 MED ORDER — ASPIRIN EC 81 MG PO TBEC
81.0000 mg | DELAYED_RELEASE_TABLET | Freq: Every day | ORAL | Status: DC
  Administered 2015-10-29 – 2015-10-31 (×3): 81 mg via ORAL
  Filled 2015-10-29 (×3): qty 1

## 2015-10-29 MED ORDER — ONDANSETRON HCL 4 MG/2ML IJ SOLN
4.0000 mg | Freq: Four times a day (QID) | INTRAMUSCULAR | Status: DC | PRN
  Administered 2015-10-29: 4 mg via INTRAVENOUS
  Filled 2015-10-29: qty 2

## 2015-10-29 MED ORDER — ALPRAZOLAM 0.5 MG PO TABS
1.0000 mg | ORAL_TABLET | Freq: Every day | ORAL | Status: DC
  Administered 2015-10-30 – 2015-10-31 (×2): 1 mg via ORAL
  Filled 2015-10-29 (×2): qty 2

## 2015-10-29 MED ORDER — CITALOPRAM HYDROBROMIDE 20 MG PO TABS
40.0000 mg | ORAL_TABLET | Freq: Every day | ORAL | Status: DC
  Administered 2015-10-29 – 2015-10-30 (×2): 40 mg via ORAL
  Filled 2015-10-29 (×2): qty 2

## 2015-10-29 MED ORDER — ONDANSETRON HCL 4 MG PO TABS: 4.0000 mg | ORAL_TABLET | Freq: Four times a day (QID) | ORAL | Status: DC | PRN

## 2015-10-29 MED ORDER — TAMSULOSIN HCL 0.4 MG PO CAPS
0.4000 mg | ORAL_CAPSULE | Freq: Every day | ORAL | Status: DC
  Administered 2015-10-29 – 2015-10-30 (×2): 0.4 mg via ORAL
  Filled 2015-10-29 (×2): qty 1

## 2015-10-29 MED ORDER — ZOLPIDEM TARTRATE 5 MG PO TABS
10.0000 mg | ORAL_TABLET | Freq: Every evening | ORAL | Status: DC | PRN
  Administered 2015-10-29 – 2015-10-30 (×2): 10 mg via ORAL
  Filled 2015-10-29 (×2): qty 2

## 2015-10-29 MED ORDER — ACETAMINOPHEN 325 MG PO TABS: 650.0000 mg | ORAL_TABLET | Freq: Four times a day (QID) | ORAL | Status: DC | PRN

## 2015-10-29 MED ORDER — SODIUM CHLORIDE 0.9 % IV BOLUS (SEPSIS)
1000.0000 mL | Freq: Once | INTRAVENOUS | Status: AC
  Administered 2015-10-29: 1000 mL via INTRAVENOUS

## 2015-10-29 MED ORDER — ENOXAPARIN SODIUM 40 MG/0.4ML ~~LOC~~ SOLN
40.0000 mg | SUBCUTANEOUS | Status: DC
  Administered 2015-10-29 – 2015-10-30 (×2): 40 mg via SUBCUTANEOUS
  Filled 2015-10-29 (×2): qty 0.4

## 2015-10-29 MED ORDER — ALPRAZOLAM 0.5 MG PO TABS: 1.0000 mg | ORAL_TABLET | Freq: Two times a day (BID) | ORAL | Status: DC

## 2015-10-29 MED ORDER — ACETAMINOPHEN 650 MG RE SUPP: 650.0000 mg | Freq: Four times a day (QID) | RECTAL | Status: DC | PRN

## 2015-10-29 MED ORDER — SODIUM CHLORIDE 0.9 % IV SOLN
INTRAVENOUS | Status: DC
  Administered 2015-10-30 – 2015-10-31 (×3): via INTRAVENOUS

## 2015-10-29 MED ORDER — CYCLOBENZAPRINE HCL 10 MG PO TABS: 10.0000 mg | ORAL_TABLET | Freq: Three times a day (TID) | ORAL | Status: DC | PRN

## 2015-10-29 MED ORDER — POTASSIUM CHLORIDE CRYS ER 20 MEQ PO TBCR
40.0000 meq | EXTENDED_RELEASE_TABLET | Freq: Once | ORAL | Status: AC
  Administered 2015-10-29: 40 meq via ORAL
  Filled 2015-10-29: qty 2

## 2015-10-29 MED ORDER — IBUPROFEN 800 MG PO TABS: 800.0000 mg | ORAL_TABLET | Freq: Every day | ORAL | Status: DC | PRN

## 2015-10-29 MED ORDER — ALPRAZOLAM 0.5 MG PO TABS
2.0000 mg | ORAL_TABLET | Freq: Every day | ORAL | Status: DC
  Administered 2015-10-29 – 2015-10-30 (×2): 2 mg via ORAL
  Filled 2015-10-29 (×3): qty 4

## 2015-10-29 NOTE — H&P (Addendum)
History and Physical  Tim KernKenneth A Righter ZOX:[id number] DOB: 09/28/1965 DOA: 10/29/2015  Referring physician: Dr Particia NearingHaviland, ED physician PCP: Pearson GrippeJames Kim, MD  Outpatient Specialists: none  Chief Complaint: Altered mental status  HPI: Tim Schwartz is a 50 y.o. male with a history of chronic back pain, hypertension, hyperlipidemia. Patient was dropped off to the emergency department by family members due to altered mental status started today. Patient is unable to provide history due to altered mental status. Some history was obtained by his brother who states that he was normal yesterday but when the patient's brother went to check on him, he was "talking out of his head". His brother states that there was no evidence of substance abuse, including alcohol or other drug paraphernalia. His brother states that he does not drink alcohol.  No evidence of hallucinations.  Per his brother, patient has been taking Pedialyte. When I asked why the patient was taking this, the patient's brother do not know   Review of Systems:   Unable to obtain secondary to altered mental status  Past Medical History:  Diagnosis Date  . Chronic back pain   . High cholesterol   . Hypertension    Past Surgical History:  Procedure Laterality Date  . BACK SURGERY     Social History:  reports that he has been smoking.  He has never used smokeless tobacco. He reports that he uses drugs, including Cocaine. He reports that he does not drink alcohol. Patient lives at Home  No Known Allergies  Family History  Problem Relation Age of Onset  . Stroke Brother      Prior to Admission medications   Medication Sig Start Date End Date Taking? Authorizing Provider  ALPRAZolam Prudy Feeler(XANAX) 1 MG tablet Take 1-2 mg by mouth 2 (two) times daily. Patient takes 1 tablet in the morning and 2 tablets at bedtime   Yes Historical Provider, MD  aspirin EC 81 MG tablet Take 81 mg by mouth daily.   Yes Historical Provider, MD  citalopram  (CELEXA) 40 MG tablet Take 40 mg by mouth at bedtime. 09/01/15  Yes Historical Provider, MD  cyclobenzaprine (FLEXERIL) 10 MG tablet Take 10 mg by mouth 3 (three) times daily as needed for muscle spasms.   Yes Historical Provider, MD  fentaNYL (DURAGESIC - DOSED MCG/HR) 100 MCG/HR Place 1 patch onto the skin every 3 (three) days. 09/03/15  Yes Historical Provider, MD  halobetasol (ULTRAVATE) 0.05 % ointment Apply 1 application topically 2 (two) times daily.   Yes Historical Provider, MD  ibuprofen (ADVIL,MOTRIN) 800 MG tablet Take 800 mg by mouth daily as needed for pain.    Yes Historical Provider, MD  PRESCRIPTION MEDICATION Apply 1 application topically daily as needed (breakout). salacid 50% petroleum jelly   Yes Historical Provider, MD  tamsulosin (FLOMAX) 0.4 MG CAPS capsule Take 0.4 mg by mouth daily. 08/04/15  Yes Historical Provider, MD  zolpidem (AMBIEN) 10 MG tablet Take 10 mg by mouth at bedtime as needed for sleep.   Yes Historical Provider, MD    Physical Exam: BP 140/91   Pulse 78   Temp 99.8 F (37.7 C) (Rectal)   Resp 19   Ht 6\' 2"  (1.88 m)   Wt 86.2 kg (190 lb)   SpO2 99%   BMI 24.39 kg/m   General: Middle-aged Caucasian male. Awake and alert and oriented to person and place. No acute cardiopulmonary distress.  HEENT: Normocephalic atraumatic.  Right and left ears normal in appearance.  Pupils equal, round, reactive to light. Extraocular muscles are intact. Sclerae anicteric and noninjected.  Moist mucosal membranes. No mucosal lesions.  Neck: Neck supple without lymphadenopathy. No carotid bruits. No masses palpated.  Cardiovascular: Regular rate with normal S1-S2 sounds. No murmurs, rubs, gallops auscultated. No JVD.  Respiratory: Good respiratory effort with no wheezes, rales, rhonchi. Lungs clear to auscultation bilaterally.  No accessory muscle use. Abdomen: Soft, nontender, nondistended. Active bowel sounds. No masses or hepatosplenomegaly  Skin: No rashes, lesions, or  ulcerations.  Dry, warm to touch. 2+ dorsalis pedis and radial pulses. Musculoskeletal: No calf or leg pain. All major joints not erythematous nontender.  No upper or lower joint deformation.  Good ROM.  No contractures  Psychiatric: Tangential with nonsensical talking at times. Pleasant and cooperative. Neurologic: No focal neurological deficits. Strength is 5/5 and symmetric in upper and lower extremities.  Cranial nerves II through XII are grossly intact.           Labs on Admission: I have personally reviewed following labs and imaging studies  CBC:  Recent Labs Lab 10/29/15 1455  WBC 16.0*  NEUTROABS 12.8*  HGB 18.7*  HCT 52.2*  MCV 97.9  PLT 187   Basic Metabolic Panel:  Recent Labs Lab 10/29/15 1455  NA 136  K 3.1*  CL 93*  CO2 28  GLUCOSE 116*  BUN 37*  CREATININE 1.06  CALCIUM 9.4   GFR: Estimated Creatinine Clearance: 94.8 mL/min (by C-G formula based on SCr of 1.06 mg/dL). Liver Function Tests:  Recent Labs Lab 10/29/15 1500  AST 26  ALT 22  ALKPHOS 84  BILITOT 1.4*  PROT 9.3*  ALBUMIN 4.6   No results for input(s): LIPASE, AMYLASE in the last 168 hours.  Recent Labs Lab 10/29/15 1618  AMMONIA <9*   Coagulation Profile:  Recent Labs Lab 10/29/15 1500  INR 1.02   Cardiac Enzymes:  Recent Labs Lab 10/29/15 1500  TROPONINI <0.03   BNP (last 3 results) No results for input(s): PROBNP in the last 8760 hours. HbA1C: No results for input(s): HGBA1C in the last 72 hours. CBG: No results for input(s): GLUCAP in the last 168 hours. Lipid Profile: No results for input(s): CHOL, HDL, LDLCALC, TRIG, CHOLHDL, LDLDIRECT in the last 72 hours. Thyroid Function Tests: No results for input(s): TSH, T4TOTAL, FREET4, T3FREE, THYROIDAB in the last 72 hours. Anemia Panel: No results for input(s): VITAMINB12, FOLATE, FERRITIN, TIBC, IRON, RETICCTPCT in the last 72 hours. Urine analysis:    Component Value Date/Time   COLORURINE AMBER (A)  10/29/2015 1630   APPEARANCEUR CLOUDY (A) 10/29/2015 1630   LABSPEC 1.025 10/29/2015 1630   PHURINE 6.0 10/29/2015 1630   GLUCOSEU NEGATIVE 10/29/2015 1630   HGBUR LARGE (A) 10/29/2015 1630   BILIRUBINUR MODERATE (A) 10/29/2015 1630   KETONESUR 40 (A) 10/29/2015 1630   PROTEINUR 100 (A) 10/29/2015 1630   UROBILINOGEN 0.2 11/06/2012 2025   NITRITE NEGATIVE 10/29/2015 1630   LEUKOCYTESUR NEGATIVE 10/29/2015 1630   Sepsis Labs: @LABRCNTIP (procalcitonin:4,lacticidven:4) )No results found for this or any previous visit (from the past 240 hour(s)).   Radiological Exams on Admission: Dg Chest 2 View  Result Date: 10/29/2015 CLINICAL DATA:  Altered mental status EXAM: CHEST  2 VIEW COMPARISON:  09/21/2015 FINDINGS: Lungs are clear.  No pleural effusion or pneumothorax. The heart is normal in size. Prominent epicardial fat at the left lung base. Visualized osseous structures are within normal limits. IMPRESSION: No evidence of acute cardiopulmonary disease. Electronically Signed   By: Lurlean Horns  Rito Ehrlich M.D.   On: 10/29/2015 15:30   Ct Head Wo Contrast  Result Date: 10/29/2015 CLINICAL DATA:  Altered mental status, confusion, and slurred speech since yesterday. EXAM: CT HEAD WITHOUT CONTRAST TECHNIQUE: Contiguous axial images were obtained from the base of the skull through the vertex without intravenous contrast. COMPARISON:  CT from [facility] on 08/09/2006 FINDINGS: Brain: No evidence of intracranial hemorrhage. No evidence of abnormal extra-axial fluid collections or mass effect. No evidence of hydrocephalus. A focal ill-defined area of decreased attenuation is seen left parietal lobe white matter on image 26/2. This was not seen in 2008, and is consistent with an lacunar infarct which is of indeterminate age radiographically. Vascular: No hyperdense vessel or unexpected calcification. Skull: No significant abnormality. Sinuses/Orbits: No acute finding. Other: None IMPRESSION: Left  parietal white matter lacunar infarct, which is of indeterminate age radiographically. No evidence of intracranial hemorrhage or mass effect. Electronically Signed   By: Myles Rosenthal M.D.   On: 10/29/2015 16:07    EKG: Independently reviewed. Sinus rhythm, old inferior infarct.  Assessment/Plan: Principal Problem:   Altered mental status Active Problems:   Hypokalemia   Hypertension   Anxiety state   This patient was discussed with the ED physician, including pertinent vitals, physical exam findings, labs, and imaging.  We also discussed care given by the ED provider.  #1 Altered to status  Uncertain etiology at this time.  We'll admit to stepdown as patient may represent nursing difficulty  Blood cultures  TSH, B-12 level, serum tox screen  Repeat CBC tomorrow  We'll get MRI #2 hypokalemia  Potassium replaced #3 hypertension  Continue home medication #4 anxiety  Continue Xanax  DVT prophylaxis: Lovenox Consultants: None Code Status: Full code Family Communication: Brother  Disposition Plan: Pending   Levie Heritage, DO [facility] Pager [phone]  If 7PM-7AM, please contact night-coverage [email or web address] Password [email or web address]

## 2015-10-29 NOTE — ED Notes (Signed)
Pt ambulated in hallway with 2 assist. Unsteady gait, pt staggering and leaning on staff intermittently.

## 2015-10-29 NOTE — ED Notes (Signed)
[phone], Anabel Halon, mother.

## 2015-10-29 NOTE — ED Provider Notes (Signed)
[facility] DEPT Provider Note   CSN: [id number] Arrival date & time: 10/29/15  1434     History   Chief Complaint Chief Complaint  Patient presents with  . Altered Mental Status    HPI Tim Schwartz is a 50 y.o. male.  Pt presents to the ED today because of altered mental status.  The pt said that he did not want to come.  Pt lives alone and his brother and daughter have been worried about him.  EMS was called out twice today, but the 1st time, pt refused to come.  Pt denies any pain, but is a poor historian.  Pt's mother said that he lives alone behind her house.  She checks on him frequently.  She said that he started acting strangely yesterday yesterday.  Today, it is worse.  He has been "talking out of his head."  She said that he has been falling a lot.  This is not normal for patient.  She also said that he has not been taking his medications.      Past Medical History:  Diagnosis Date  . Chronic back pain   . High cholesterol   . Hypertension     Patient Active Problem List   Diagnosis Date Noted  . Altered mental status 10/29/2015    Past Surgical History:  Procedure Laterality Date  . BACK SURGERY         Home Medications    Prior to Admission medications   Medication Sig Start Date End Date Taking? Authorizing Provider  ALPRAZolam Prudy Feeler) 1 MG tablet Take 1-2 mg by mouth 2 (two) times daily. Patient takes 1 tablet in the morning and 2 tablets at bedtime   Yes Historical Provider, MD  aspirin EC 81 MG tablet Take 81 mg by mouth daily.   Yes Historical Provider, MD  citalopram (CELEXA) 40 MG tablet Take 40 mg by mouth at bedtime. 09/01/15  Yes Historical Provider, MD  cyclobenzaprine (FLEXERIL) 10 MG tablet Take 10 mg by mouth 3 (three) times daily as needed for muscle spasms.   Yes Historical Provider, MD  fentaNYL (DURAGESIC - DOSED MCG/HR) 100 MCG/HR Place 1 patch onto the skin every 3 (three) days. 09/03/15  Yes Historical Provider, MD  halobetasol  (ULTRAVATE) 0.05 % ointment Apply 1 application topically 2 (two) times daily.   Yes Historical Provider, MD  ibuprofen (ADVIL,MOTRIN) 800 MG tablet Take 800 mg by mouth daily as needed for pain.    Yes Historical Provider, MD  PRESCRIPTION MEDICATION Apply 1 application topically daily as needed (breakout). salacid 50% petroleum jelly   Yes Historical Provider, MD  tamsulosin (FLOMAX) 0.4 MG CAPS capsule Take 0.4 mg by mouth daily. 08/04/15  Yes Historical Provider, MD  zolpidem (AMBIEN) 10 MG tablet Take 10 mg by mouth at bedtime as needed for sleep.   Yes Historical Provider, MD    Family History No family history on file.  Social History Social History  Substance Use Topics  . Smoking status: Current Every Day Smoker  . Smokeless tobacco: Never Used  . Alcohol use No     Allergies   Review of patient's allergies indicates no known allergies.   Review of Systems Review of Systems  All other systems reviewed and are negative.    Physical Exam Updated Vital Signs BP 138/85   Pulse 82   Temp 99.8 F (37.7 C) (Rectal)   Resp 12   Ht 6\' 2"  (1.88 m)   Wt 190 lb (86.2  kg)   SpO2 98%   BMI 24.39 kg/m   Physical Exam  Constitutional: He is oriented to person, place, and time. He appears well-developed and well-nourished.  HENT:  Head: Normocephalic and atraumatic.  Right Ear: External ear normal.  Left Ear: External ear normal.  Nose: Nose normal.  Mouth/Throat: Oropharynx is clear and moist.  Eyes: Conjunctivae and EOM are normal. Pupils are equal, round, and reactive to light.  Neck: Normal range of motion. Neck supple.  Cardiovascular: Normal rate, regular rhythm, normal heart sounds and intact distal pulses.   Pulmonary/Chest: Effort normal and breath sounds normal.  Abdominal: Soft. Bowel sounds are normal.  Musculoskeletal: Normal range of motion.  Neurological: He is alert and oriented to person, place, and time.  Pt is awake and alert and is moving all 4  extremities.  However, he will make statements that don't make sense or have anything to do with what I asked.  Skin: Skin is warm and dry.  2 rectangular areas on chest with adhesive residue from fentanyl patches.  Pt said they have been off since yesterday.  Psychiatric: His affect is inappropriate. His speech is tangential. Cognition and memory are impaired.  Nursing note and vitals reviewed.    ED Treatments / Results  Labs (all labs ordered are listed, but only abnormal results are displayed) Labs Reviewed  CBC WITH DIFFERENTIAL/PLATELET - Abnormal; Notable for the following:       Result Value   WBC 16.0 (*)    Hemoglobin 18.7 (*)    HCT 52.2 (*)    MCH 35.1 (*)    Neutro Abs 12.8 (*)    All other components within normal limits  BASIC METABOLIC PANEL - Abnormal; Notable for the following:    Potassium 3.1 (*)    Chloride 93 (*)    Glucose, Bld 116 (*)    BUN 37 (*)    All other components within normal limits  URINALYSIS, ROUTINE W REFLEX MICROSCOPIC (NOT AT [facility]) - Abnormal; Notable for the following:    Color, Urine AMBER (*)    APPearance CLOUDY (*)    Hgb urine dipstick LARGE (*)    Bilirubin Urine MODERATE (*)    Ketones, ur 40 (*)    Protein, ur 100 (*)    All other components within normal limits  URINE RAPID DRUG SCREEN, HOSP PERFORMED - Abnormal; Notable for the following:    Benzodiazepines POSITIVE (*)    All other components within normal limits  ETHANOL - Abnormal; Notable for the following:    Alcohol, Ethyl (B) 9 (*)    All other components within normal limits  HEPATIC FUNCTION PANEL - Abnormal; Notable for the following:    Total Protein 9.3 (*)    Total Bilirubin 1.4 (*)    Indirect Bilirubin 1.2 (*)    All other components within normal limits  AMMONIA - Abnormal; Notable for the following:    Ammonia <9 (*)    All other components within normal limits  URINE MICROSCOPIC-ADD ON - Abnormal; Notable for the following:    Squamous Epithelial /  LPF 0-5 (*)    Bacteria, UA RARE (*)    All other components within normal limits  LACTIC ACID, PLASMA  PROTIME-INR  TROPONIN I    EKG  EKG Interpretation  Date/Time:  Saturday October 29 2015 14:39:59 EDT Ventricular Rate:  80 PR Interval:    QRS Duration: 100 QT Interval:  391 QTC Calculation: 451 R Axis:   -45  Text Interpretation:  Sinus rhythm Abnormal R-wave progression, early transition Inferior infarct, old Lateral leads are also involved Confirmed by [facility] MD, Adaisha Campise [phone]) on 10/29/2015 3:05:32 PM       Radiology Dg Chest 2 View  Result Date: 10/29/2015 CLINICAL DATA:  Altered mental status EXAM: CHEST  2 VIEW COMPARISON:  09/21/2015 FINDINGS: Lungs are clear.  No pleural effusion or pneumothorax. The heart is normal in size. Prominent epicardial fat at the left lung base. Visualized osseous structures are within normal limits. IMPRESSION: No evidence of acute cardiopulmonary disease. Electronically Signed   By: Charline Bills M.D.   On: 10/29/2015 15:30   Ct Head Wo Contrast  Result Date: 10/29/2015 CLINICAL DATA:  Altered mental status, confusion, and slurred speech since yesterday. EXAM: CT HEAD WITHOUT CONTRAST TECHNIQUE: Contiguous axial images were obtained from the base of the skull through the vertex without intravenous contrast. COMPARISON:  CT from [facility] on 08/09/2006 FINDINGS: Brain: No evidence of intracranial hemorrhage. No evidence of abnormal extra-axial fluid collections or mass effect. No evidence of hydrocephalus. A focal ill-defined area of decreased attenuation is seen left parietal lobe white matter on image 26/2. This was not seen in 2008, and is consistent with an lacunar infarct which is of indeterminate age radiographically. Vascular: No hyperdense vessel or unexpected calcification. Skull: No significant abnormality. Sinuses/Orbits: No acute finding. Other: None IMPRESSION: Left parietal white matter lacunar infarct, which is  of indeterminate age radiographically. No evidence of intracranial hemorrhage or mass effect. Electronically Signed   By: Myles Rosenthal M.D.   On: 10/29/2015 16:07    Procedures Procedures (including critical care time)  Medications Ordered in ED Medications  sodium chloride 0.9 % bolus 1,000 mL (0 mLs Intravenous Stopped 10/29/15 1627)    And  0.9 %  sodium chloride infusion (not administered)  potassium chloride SA (K-DUR,KLOR-CON) CR tablet 40 mEq (40 mEq Oral Given 10/29/15 1743)     Initial Impression / Assessment and Plan / ED Course  I have reviewed the triage vital signs and the nursing notes.  Pertinent labs & imaging results that were available during my care of the patient were reviewed by me and considered in my medical decision making (see chart for details).  Clinical Course  Pt is able to ambulate, but requires 2 people to assist him and he is very unsteady.  Pt is still very confused.  I am not sure if this is from a CVA or from withdrawal, but he certainly needs at least an observation stay and possibly a MRI.  Pt d/w Dr. Adrian Blackwater who will admit.  Final Clinical Impressions(s) / ED Diagnoses   Final diagnoses:  Altered mental status, unspecified altered mental status type  Ambulatory dysfunction  Dehydration  Hypokalemia    New Prescriptions New Prescriptions   No medications on file     Jacalyn Lefevre, MD 10/29/15 [phone]

## 2015-10-29 NOTE — ED Notes (Addendum)
Dr. Stinson at bedside.  

## 2015-10-29 NOTE — ED Triage Notes (Signed)
EMS reports family called ems because pt has been confused since yesterday.  EMS says they were called out twice today.  Pt refused to come to the hospital initially.  Pt oriented to self and place.

## 2015-10-29 NOTE — ED Notes (Addendum)
Pt alert to person and place, able to state date of birth. Pt otherwise confused-intermittently making random statements that do not make sense, excessively cursing.  Pt states he lives by himself and his brother and daughter made him come. Square adhesive residue noted to left and right chest-pt states he had on pain patches and took them off yesterday. Denies pain. Sclera appear jaundice.

## 2015-10-30 ENCOUNTER — Inpatient Hospital Stay (HOSPITAL_COMMUNITY): Payer: Medicaid Other

## 2015-10-30 DIAGNOSIS — G92 Toxic encephalopathy: Secondary | ICD-10-CM

## 2015-10-30 LAB — COMPREHENSIVE METABOLIC PANEL
ALT: 21 U/L (ref 17–63)
AST: 20 U/L (ref 15–41)
Albumin: 4 g/dL (ref 3.5–5.0)
Alkaline Phosphatase: 72 U/L (ref 38–126)
Anion gap: 12 (ref 5–15)
BILIRUBIN TOTAL: 1.3 mg/dL — AB (ref 0.3–1.2)
BUN: 34 mg/dL — AB (ref 6–20)
CO2: 28 mmol/L (ref 22–32)
CREATININE: 0.98 mg/dL (ref 0.61–1.24)
Calcium: 9.2 mg/dL (ref 8.9–10.3)
Chloride: 97 mmol/L — ABNORMAL LOW (ref 101–111)
GFR calc Af Amer: >60 mL/min (ref >60)
Glucose, Bld: 115 mg/dL — ABNORMAL HIGH (ref 65–99)
POTASSIUM: 3.2 mmol/L — AB (ref 3.5–5.1)
Sodium: 137 mmol/L (ref 135–145)
TOTAL PROTEIN: 8 g/dL (ref 6.5–8.1)

## 2015-10-30 LAB — CBC
HEMATOCRIT: 48.4 % (ref 39.0–52.0)
Hemoglobin: 17.1 g/dL — ABNORMAL HIGH (ref 13.0–17.0)
MCH: 34.8 pg — AB (ref 26.0–34.0)
MCHC: 35.3 g/dL (ref 30.0–36.0)
MCV: 98.6 fL (ref 78.0–100.0)
Platelets: 183 10*3/uL (ref 150–400)
RBC: 4.91 MIL/uL (ref 4.22–5.81)
RDW: 13.6 % (ref 11.5–15.5)
WBC: 13.8 10*3/uL — AB (ref 4.0–10.5)

## 2015-10-30 LAB — VITAMIN B12: Vitamin B-12: 152 pg/mL — ABNORMAL LOW (ref 180–914)

## 2015-10-30 LAB — MRSA PCR SCREENING: MRSA by PCR: NEGATIVE

## 2015-10-30 MED ORDER — LORAZEPAM 2 MG/ML IJ SOLN
2.0000 mg | Freq: Once | INTRAMUSCULAR | Status: AC
  Administered 2015-10-30: 2 mg via INTRAVENOUS
  Filled 2015-10-30: qty 1

## 2015-10-30 MED ORDER — HALOPERIDOL LACTATE 5 MG/ML IJ SOLN: 5.0000 mg | Freq: Four times a day (QID) | INTRAMUSCULAR | Status: DC | PRN

## 2015-10-30 MED ORDER — NICOTINE 14 MG/24HR TD PT24
14.0000 mg | MEDICATED_PATCH | Freq: Every day | TRANSDERMAL | Status: DC
  Administered 2015-10-30: 14 mg via TRANSDERMAL
  Filled 2015-10-30 (×2): qty 1

## 2015-10-30 MED ORDER — HALOPERIDOL LACTATE 5 MG/ML IJ SOLN
5.0000 mg | Freq: Four times a day (QID) | INTRAMUSCULAR | Status: DC | PRN
  Administered 2015-10-30 (×2): 5 mg via INTRAVENOUS
  Filled 2015-10-30 (×2): qty 1

## 2015-10-30 MED ORDER — FENTANYL 100 MCG/HR TD PT72
100.0000 ug | MEDICATED_PATCH | TRANSDERMAL | Status: DC
  Administered 2015-10-30: 100 ug via TRANSDERMAL
  Filled 2015-10-30: qty 1

## 2015-10-30 NOTE — Progress Notes (Signed)
During report was notified that patient did not void all night nor did he state that he needed to. Performed a bladder scan on patient at 0740 and there was 430 mL of urine noted. Asked patient if he needed to void and he stated no. Offered to In and Out Cath him to relieve the urine in the bladder, and he refused and said " I will urinate after I receive my medicine." Patient is slightly confused, agitated, and has an aggressive tone. The medication he is referring to is his fentanyl patches that he uses at home. MD made aware of situation. Will continue to monitor, offer urinal, and give encouragement.   Genelle Bal, RN

## 2015-10-30 NOTE — Progress Notes (Signed)
PROGRESS NOTE    Tim Schwartz  IFB:[id number] DOB: April 08, 1965 DOA: 10/29/2015 PCP: Pearson Grippe, MD    Brief Narrative:  50 y.o. male with a history of chronic back pain, hypertension, hyperlipidemia. Patient was dropped off to the emergency department by family members due to altered mental status started one day prior to hospital admission. Patient found to have only benzo's in urine drug screen  Assessment & Plan:   Principal Problem:   Altered mental status Active Problems:   Hypokalemia   Hypertension   Anxiety state   1. toxic metabolic encephalopathy 1. Unclear etiology 2. MRI unremarkable for acute process 3. UDS pos only for benzos (pt on benzo's prior to admission), however neg for opiates despite reportedly being on fentanyl patch  4. See note this AM. Patient became markedly agitated and has no insight to his medical situation. Family agrees patient's mentation is altered. Patient is unable to make own decisions at this time. Case discussed with Psychiatry, with recommendations noted previously.  5. For now, patient remains involuntarily committed, pending improvement of mental status and being able to make own decisions 6. Will continue PRN haldol for agitation. QTc 451 2. Hypokalemia 1. Will follow and correct as needed 3. HTN 1. BP stable 2. Continue to monitor 4. Anxiety 1. Cont benzo's per home regimen 5. Chronic pain 1. Per psychiatry, recommendation to resume fentanyl patch while here 6. Urinary retention 1. Over 400cc noted on bladder scan 2. Given pt's level of agitation, concern foley cath may result in urethral trauma/injury 3. Will give trial of flomax for now 4. Renal function stable at present  DVT prophylaxis: Lovenox Code Status: Full Family Communication: Pt in room, family at bedside Disposition Plan: Uncertain at this time  Consultants:   Discussed case with Psychiatry  Procedures:     Antimicrobials: Anti-infectives    None       Subjective: Agitated and aggressive this AM. No insight to current medical situation  Objective: Vitals:   10/30/15 0900 10/30/15 1100 10/30/15 1113 10/30/15 1200  BP: 129/87 114/76 118/82 116/85  Pulse: 79 88 73 85  Resp: (!) 22 18 (!) 21 19  Temp:   (!) 96.9 F (36.1 C)   TempSrc:   Axillary   SpO2: 99% 98% 95% 96%  Weight:      Height:        Intake/Output Summary (Last 24 hours) at 10/30/15 1434 Last data filed at 10/30/15 0014  Gross per 24 hour  Intake              240 ml  Output                0 ml  Net              240 ml   Filed Weights   10/29/15 1436 10/30/15 0500  Weight: 86.2 kg (190 lb) 81.1 kg (178 lb 12.7 oz)    Examination:  General exam: Agitated, confused, becoming violent Respiratory system: Clear to auscultation. Respiratory effort normal. Cardiovascular system: S1 & S2 heard, RRR.  Gastrointestinal system: Abdomen is nondistended, soft and nontender. No organomegaly or masses felt. Normal bowel sounds heard. Central nervous system: Alert and oriented. No focal neurological deficits. Extremities: Symmetric 5 x 5 power. Skin: No rashes, lesions Psychiatry: Lacks appropriate judgement and insight. Agitated, aggressive, becoming violent.   Data Reviewed: I have personally reviewed following labs and imaging studies  CBC:  Recent Labs Lab 10/29/15 1455 10/30/15 0422  WBC 16.0* 13.8*  NEUTROABS 12.8*  --   HGB 18.7* 17.1*  HCT 52.2* 48.4  MCV 97.9 98.6  PLT 187 183   Basic Metabolic Panel:  Recent Labs Lab 10/29/15 1455 10/29/15 1921 10/30/15 0422  NA 136  --  137  K 3.1*  --  3.2*  CL 93*  --  97*  CO2 28  --  28  GLUCOSE 116*  --  115*  BUN 37*  --  34*  CREATININE 1.06 0.94 0.98  CALCIUM 9.4  --  9.2   GFR: Estimated Creatinine Clearance: 101.1 mL/min (by C-G formula based on SCr of 0.98 mg/dL). Liver Function Tests:  Recent Labs Lab 10/29/15 1500 10/30/15 0422  AST 26 20  ALT 22 21  ALKPHOS 84 72  BILITOT 1.4*  1.3*  PROT 9.3* 8.0  ALBUMIN 4.6 4.0   No results for input(s): LIPASE, AMYLASE in the last 168 hours.  Recent Labs Lab 10/29/15 1618  AMMONIA <9*   Coagulation Profile:  Recent Labs Lab 10/29/15 1500  INR 1.02   Cardiac Enzymes:  Recent Labs Lab 10/29/15 1500  TROPONINI <0.03   BNP (last 3 results) No results for input(s): PROBNP in the last 8760 hours. HbA1C: No results for input(s): HGBA1C in the last 72 hours. CBG: No results for input(s): GLUCAP in the last 168 hours. Lipid Profile: No results for input(s): CHOL, HDL, LDLCALC, TRIG, CHOLHDL, LDLDIRECT in the last 72 hours. Thyroid Function Tests:  Recent Labs  10/29/15 1921  TSH 1.943   Anemia Panel: No results for input(s): VITAMINB12, FOLATE, FERRITIN, TIBC, IRON, RETICCTPCT in the last 72 hours. Sepsis Labs:  Recent Labs Lab 10/29/15 1618  LATICACIDVEN 1.7    Recent Results (from the past 240 hour(s))  MRSA PCR Screening     Status: None   Collection Time: 10/29/15  7:00 PM  Result Value Ref Range Status   MRSA by PCR NEGATIVE NEGATIVE Final    Comment:        The GeneXpert MRSA Assay (FDA approved for NASAL specimens only), is one component of a comprehensive MRSA colonization surveillance program. It is not intended to diagnose MRSA infection nor to guide or monitor treatment for MRSA infections.   Culture, blood (routine x 2)     Status: None (Preliminary result)   Collection Time: 10/29/15  7:21 PM  Result Value Ref Range Status   Specimen Description BLOOD RIGHT HAND  Final   Special Requests BOTTLES DRAWN AEROBIC AND ANAEROBIC 6CC EACH  Final   Culture NO GROWTH < 24 HOURS  Final   Report Status PENDING  Incomplete  Culture, blood (routine x 2)     Status: None (Preliminary result)   Collection Time: 10/29/15  7:33 PM  Result Value Ref Range Status   Specimen Description RIGHT ANTECUBITAL  Final   Special Requests BOTTLES DRAWN AEROBIC AND ANAEROBIC 6CC EACH  Final   Culture  NO GROWTH < 24 HOURS  Final   Report Status PENDING  Incomplete     Radiology Studies: Dg Chest 2 View  Result Date: 10/29/2015 CLINICAL DATA:  Altered mental status EXAM: CHEST  2 VIEW COMPARISON:  09/21/2015 FINDINGS: Lungs are clear.  No pleural effusion or pneumothorax. The heart is normal in size. Prominent epicardial fat at the left lung base. Visualized osseous structures are within normal limits. IMPRESSION: No evidence of acute cardiopulmonary disease. Electronically Signed   By: Charline BillsSriyesh  Krishnan M.D.   On: 10/29/2015 15:30   Ct  Head Wo Contrast  Result Date: 10/29/2015 CLINICAL DATA:  Altered mental status, confusion, and slurred speech since yesterday. EXAM: CT HEAD WITHOUT CONTRAST TECHNIQUE: Contiguous axial images were obtained from the base of the skull through the vertex without intravenous contrast. COMPARISON:  CT from [facility] on 08/09/2006 FINDINGS: Brain: No evidence of intracranial hemorrhage. No evidence of abnormal extra-axial fluid collections or mass effect. No evidence of hydrocephalus. A focal ill-defined area of decreased attenuation is seen left parietal lobe white matter on image 26/2. This was not seen in 2008, and is consistent with an lacunar infarct which is of indeterminate age radiographically. Vascular: No hyperdense vessel or unexpected calcification. Skull: No significant abnormality. Sinuses/Orbits: No acute finding. Other: None IMPRESSION: Left parietal white matter lacunar infarct, which is of indeterminate age radiographically. No evidence of intracranial hemorrhage or mass effect. Electronically Signed   By: Myles Rosenthal M.D.   On: 10/29/2015 16:07   Mr Brain Wo Contrast  Result Date: 10/30/2015 CLINICAL DATA:  Altered mental status with confusion and slurred speech. EXAM: MRI HEAD WITHOUT CONTRAST TECHNIQUE: Multiplanar, multiecho pulse sequences of the brain and surrounding structures were obtained without intravenous contrast. COMPARISON:   Head CT 10/29/2015 FINDINGS: There is no evidence of acute infarct, intracranial hemorrhage, mass, midline shift, or extra-axial fluid collection. The ventricles are normal in size. Mild cerebral atrophy is most notable in the frontal lobes. Subcentimeter foci of T2 hyperintensity in the subcortical and deep cerebral white matter bilaterally are mildly greater than expected for patient's age, with the most prominent focus being in the left corona radiata in accounting for the abnormality on CT. The orbits are unremarkable. The paranasal sinuses are clear. There are small to moderate right and small left mastoid effusions. Major intracranial vascular flow voids are preserved, with the old right vertebral artery being dominant. No suspicious osseous lesion is identified. IMPRESSION: 1. No acute intracranial abnormality. 2. Mild cerebral white matter disease, nonspecific. Considerations include chronic small vessel ischemia, sequelae of trauma, hypercoagulable state, vasculitis, migraines, prior infection, and demyelination. Electronically Signed   By: Sebastian Ache M.D.   On: 10/30/2015 13:24    Scheduled Meds: . ALPRAZolam  1 mg Oral Daily   And  . ALPRAZolam  2 mg Oral QHS  . aspirin EC  81 mg Oral Daily  . citalopram  40 mg Oral QHS  . enoxaparin (LOVENOX) injection  40 mg Subcutaneous Q24H  . fentaNYL  100 mcg Transdermal Q72H  . tamsulosin  0.4 mg Oral QHS   Continuous Infusions: . sodium chloride 125 mL/hr at 10/30/15 0624     LOS: 1 day   Duvan Mousel, Scheryl Marten, MD [facility] Pager [phone]  If 7PM-7AM, please contact night-coverage [email or web address] Password [facility] 10/30/2015, 2:34 PM

## 2015-10-30 NOTE — Progress Notes (Signed)
Upon MD rounding on patient, the patient became very agressive, impulsive, and verbally abusive to MD and staff. He jumped out of his bed and ripped out his IV A blood trail was made from his bed to the hall where he walked to, and continued to be verbally abusive to staff. His mom was present for the incident and decided to leave so she could help decrease his agitation.  Security was called immediately. Patient finally calmed down and returned to bed. He was cleaned up and room was cleaned up. A new IV access was established, PRN medications were ordered, and therapeutic communication was used to keep patient in a calm manner. Reorientation was established as well.   Mother recommends that patient be involuntary committed because she thinks he is not able to make safe decisions about his healthcare. Mother discussed this with Md.  Genelle Bal, RN

## 2015-10-30 NOTE — Treatment Plan (Signed)
Patient seen. Full note to follow. Patient remains agitated and confused. Alert only to place. Patient has no insight to medical situation and is demanding more pain meds. Family at bedside and patient became more agitated and aggressive. Family agrees patient is currently confused and is unable to make decisions. Patient climbed out of bed and attempted to walk out. Security called. Discussed case with psychiatry who recommends 2mg  ativan and 5mg  haldol x 1 now. Psychiatry recommends resuming pain meds per home regimen while here. Also recommendation for involuntary commitment.

## 2015-10-30 NOTE — Progress Notes (Signed)
Re-scanned patient's bladder and the scanner registered over in the bladder. Asked patient to please void into the urinal and he refused. Informed patient he would have to endure an In and Out Cath if he did not void in the urinal. He did not respond  Performed In and Out Cath and removed of tea colored urine. Patient tolerated the procedure well and is resting.   Genelle Bal, RN

## 2015-10-31 DIAGNOSIS — F1193 Opioid use, unspecified with withdrawal: Secondary | ICD-10-CM

## 2015-10-31 LAB — BASIC METABOLIC PANEL
Anion gap: 5 (ref 5–15)
BUN: 23 mg/dL — AB (ref 6–20)
CALCIUM: 8.4 mg/dL — AB (ref 8.9–10.3)
CO2: 29 mmol/L (ref 22–32)
CREATININE: 0.97 mg/dL (ref 0.61–1.24)
Chloride: 104 mmol/L (ref 101–111)
GFR calc Af Amer: >60 mL/min (ref >60)
Glucose, Bld: 75 mg/dL (ref 65–99)
Potassium: 3.3 mmol/L — ABNORMAL LOW (ref 3.5–5.1)
SODIUM: 138 mmol/L (ref 135–145)

## 2015-10-31 LAB — CBC
HCT: 41.5 % (ref 39.0–52.0)
Hemoglobin: 14.2 g/dL (ref 13.0–17.0)
MCH: 34.3 pg — ABNORMAL HIGH (ref 26.0–34.0)
MCHC: 34.2 g/dL (ref 30.0–36.0)
MCV: 100.2 fL — ABNORMAL HIGH (ref 78.0–100.0)
PLATELETS: 133 10*3/uL — AB (ref 150–400)
RBC: 4.14 MIL/uL — ABNORMAL LOW (ref 4.22–5.81)
RDW: 13.7 % (ref 11.5–15.5)
WBC: 9.4 10*3/uL (ref 4.0–10.5)

## 2015-10-31 MED ORDER — POTASSIUM CHLORIDE CRYS ER 20 MEQ PO TBCR
60.0000 meq | EXTENDED_RELEASE_TABLET | Freq: Once | ORAL | Status: AC
  Administered 2015-10-31: 60 meq via ORAL
  Filled 2015-10-31: qty 3

## 2015-10-31 NOTE — Discharge Summary (Addendum)
Physician Discharge Summary  Tim KernKenneth A Schwartz WUJ:[id number] DOB: 03/20/1965 DOA: 10/29/2015  PCP: Tim GrippeJames Kim, MD  Admit date: 10/29/2015 Discharge date: 10/31/2015  Admitted From: Home Disposition:  Home  Recommendations for Outpatient Follow-up:  1. Follow up with PCP in 1-2 weeks   Discharge Condition:Improved CODE STATUS:Full Diet recommendation: Regular   Brief/Interim Summary: 50 y.o.malewith a history of chronic back pain, hypertension, hyperlipidemia. Patient was dropped off to the emergency department by family members due to altered mental status started one day prior to hospital admission. Patient found to have only benzo's in urine drug screen  1. toxic metabolic encephalopathy 1. Suspect related to opioid withdrawal 2. MRI unremarkable for acute process 3. UDS pos only for benzos (pt on benzo's prior to admission), however neg for opiates despite reportedly being on fentanyl patch  4. Early this admission, patient became markedly agitated and had no insight to his medical situation. Patient was involuntarily committed. 5. Fentanyl was continued and by the following morning, patient was noted to be alert/oriented with full insight to his medical situation. 6. On further questioning, patient states he "got tired" of placing fentanyl patches, thus had been without patch for approximately one week prior to hospital admission. 7. Patient reminded to continue to take medications as tolerated 2. Hypokalemia 1. Corrected 3. HTN 1. BP remained stable 4. Anxiety 1. Cont benzo's per home regimen 5. Chronic pain 1. Per psychiatry, recommendation to resume fentanyl patch while here 6. Urinary retention 1. Over 900cc noted on bladder scan 2. Patient did undergo I/O cath yielding over 700 urine this admission 3. Renal function remained stable  Discharge Diagnoses:  Principal Problem:   Opioid use with withdrawal (HCC) Active Problems:   Altered mental status   Hypokalemia    Hypertension   Anxiety state    Discharge Instructions     Medication List    TAKE these medications   ALPRAZolam 1 MG tablet Commonly known as:  XANAX Take 1-2 mg by mouth 2 (two) times daily. Patient takes 1 tablet in the morning and 2 tablets at bedtime   aspirin EC 81 MG tablet Take 81 mg by mouth daily.   citalopram 40 MG tablet Commonly known as:  CELEXA Take 40 mg by mouth at bedtime.   cyclobenzaprine 10 MG tablet Commonly known as:  FLEXERIL Take 10 mg by mouth 3 (three) times daily as needed for muscle spasms.   fentaNYL 100 MCG/HR Commonly known as:  DURAGESIC - dosed mcg/hr Place 1 patch onto the skin every 3 (three) days.   halobetasol 0.05 % ointment Commonly known as:  ULTRAVATE Apply 1 application topically 2 (two) times daily.   ibuprofen 800 MG tablet Commonly known as:  ADVIL,MOTRIN Take 800 mg by mouth daily as needed for pain.   PRESCRIPTION MEDICATION Apply 1 application topically daily as needed (breakout). salacid 50% petroleum jelly   tamsulosin 0.4 MG Caps capsule Commonly known as:  FLOMAX Take 0.4 mg by mouth daily.   zolpidem 10 MG tablet Commonly known as:  AMBIEN Take 10 mg by mouth at bedtime as needed for sleep.      Follow-up Information    Tim GrippeJames Kim, MD .   Specialty:  Internal Medicine Why:  Follow up as scheduled Contact information: [address] [place] [address] [phone]          No Known Allergies  Consultations:  Discussed case with Psychiatry  Procedures/Studies: Dg Chest 2 View  Result Date: 10/29/2015 CLINICAL DATA:  Altered mental status EXAM: CHEST  2 VIEW COMPARISON:  09/21/2015 FINDINGS: Lungs are clear.  No pleural effusion or pneumothorax. The heart is normal in size. Prominent epicardial fat at the left lung base. Visualized osseous structures are within normal limits. IMPRESSION: No evidence of acute cardiopulmonary disease. Electronically Signed   By: Charline Bills M.D.   On:  10/29/2015 15:30   Ct Head Wo Contrast  Result Date: 10/29/2015 CLINICAL DATA:  Altered mental status, confusion, and slurred speech since yesterday. EXAM: CT HEAD WITHOUT CONTRAST TECHNIQUE: Contiguous axial images were obtained from the base of the skull through the vertex without intravenous contrast. COMPARISON:  [facility] on 08/09/2006 FINDINGS: Brain: No evidence of intracranial hemorrhage. No evidence of abnormal extra-axial fluid collections or mass effect. No evidence of hydrocephalus. A focal ill-defined area of decreased attenuation is seen left parietal lobe white matter on image 26/2. This was not seen in 2008, and is consistent with an lacunar infarct which is of indeterminate age radiographically. Vascular: No hyperdense vessel or unexpected calcification. Skull: No significant abnormality. Sinuses/Orbits: No acute finding. Other: None IMPRESSION: Left parietal white matter lacunar infarct, which is of indeterminate age radiographically. No evidence of intracranial hemorrhage or mass effect. Electronically Signed   By: Myles Rosenthal M.D.   On: 10/29/2015 16:07   Mr Brain Wo Contrast  Result Date: 10/30/2015 CLINICAL DATA:  Altered mental status with confusion and slurred speech. EXAM: MRI HEAD WITHOUT CONTRAST TECHNIQUE: Multiplanar, multiecho pulse sequences of the brain and surrounding structures were obtained without intravenous contrast. COMPARISON:  Head CT 10/29/2015 FINDINGS: There is no evidence of acute infarct, intracranial hemorrhage, mass, midline shift, or extra-axial fluid collection. The ventricles are normal in size. Mild cerebral atrophy is most notable in the frontal lobes. Subcentimeter foci of T2 hyperintensity in the subcortical and deep cerebral white matter bilaterally are mildly greater than expected for patient's age, with the most prominent focus being in the left corona radiata in accounting for the abnormality on CT. The orbits are unremarkable.  The paranasal sinuses are clear. There are small to moderate right and small left mastoid effusions. Major intracranial vascular flow voids are preserved, with the old right vertebral artery being dominant. No suspicious osseous lesion is identified. IMPRESSION: 1. No acute intracranial abnormality. 2. Mild cerebral white matter disease, nonspecific. Considerations include chronic small vessel ischemia, sequelae of trauma, hypercoagulable state, vasculitis, migraines, prior infection, and demyelination. Electronically Signed   By: Sebastian Ache M.D.   On: 10/30/2015 13:24    Subjective: Feeling better. Eager to go home  Discharge Exam: Vitals:   10/31/15 0400 10/31/15 0805  BP:    Pulse:    Resp:    Temp: 98.8 F (37.1 C) 97.6 F (36.4 C)   Vitals:   10/31/15 0000 10/31/15 0400 10/31/15 0500 10/31/15 0805  BP:      Pulse:      Resp:      Temp: 98.1 F (36.7 C) 98.8 F (37.1 C)  97.6 F (36.4 C)  TempSrc: Axillary Axillary  Axillary  SpO2:      Weight:   83.2 kg (183 lb 6.8 oz)   Height:        General: Pt is alert, awake, not in acute distress, a&ox3 Cardiovascular: RRR, S1/S2 +, no rubs, no gallops Respiratory: CTA bilaterally, no wheezing, no rhonchi Abdominal: Soft, NT, ND, bowel sounds + Extremities: no edema, no cyanosis   The results of significant diagnostics from this hospitalization (  including imaging, microbiology, ancillary and laboratory) are listed below for reference.     Microbiology: Recent Results (from the past 240 hour(s))  MRSA PCR Screening     Status: None   Collection Time: 10/29/15  7:00 PM  Result Value Ref Range Status   MRSA by PCR NEGATIVE NEGATIVE Final    Comment:        The GeneXpert MRSA Assay (FDA approved for NASAL specimens only), is one component of a comprehensive MRSA colonization surveillance program. It is not intended to diagnose MRSA infection nor to guide or monitor treatment for MRSA infections.   Culture, blood  (routine x 2)     Status: None (Preliminary result)   Collection Time: 10/29/15  7:21 PM  Result Value Ref Range Status   Specimen Description BLOOD RIGHT HAND  Final   Special Requests BOTTLES DRAWN AEROBIC AND ANAEROBIC 6CC EACH  Final   Culture NO GROWTH < 24 HOURS  Final   Report Status PENDING  Incomplete  Culture, blood (routine x 2)     Status: None (Preliminary result)   Collection Time: 10/29/15  7:33 PM  Result Value Ref Range Status   Specimen Description RIGHT ANTECUBITAL  Final   Special Requests BOTTLES DRAWN AEROBIC AND ANAEROBIC 6CC EACH  Final   Culture NO GROWTH < 24 HOURS  Final   Report Status PENDING  Incomplete     Labs: BNP (last 3 results) No results for input(s): BNP in the last 8760 hours. Basic Metabolic Panel:  Recent Labs Lab 10/29/15 1455 10/29/15 1921 10/30/15 0422 10/31/15 0507  NA 136  --  137 138  K 3.1*  --  3.2* 3.3*  CL 93*  --  97* 104  CO2 28  --  28 29  GLUCOSE 116*  --  115* 75  BUN 37*  --  34* 23*  CREATININE 1.06 0.94 0.98 0.97  CALCIUM 9.4  --  9.2 8.4*   Liver Function Tests:  Recent Labs Lab 10/29/15 1500 10/30/15 0422  AST 26 20  ALT 22 21  ALKPHOS 84 72  BILITOT 1.4* 1.3*  PROT 9.3* 8.0  ALBUMIN 4.6 4.0   No results for input(s): LIPASE, AMYLASE in the last 168 hours.  Recent Labs Lab 10/29/15 1618  AMMONIA <9*   CBC:  Recent Labs Lab 10/29/15 1455 10/30/15 0422 10/31/15 0507  WBC 16.0* 13.8* 9.4  NEUTROABS 12.8*  --   --   HGB 18.7* 17.1* 14.2  HCT 52.2* 48.4 41.5  MCV 97.9 98.6 100.2*  PLT 187 183 133*   Cardiac Enzymes:  Recent Labs Lab 10/29/15 1500  TROPONINI <0.03   BNP: Invalid input(s): POCBNP CBG: No results for input(s): GLUCAP in the last 168 hours. D-Dimer No results for input(s): DDIMER in the last 72 hours. Hgb A1c No results for input(s): HGBA1C in the last 72 hours. Lipid Profile No results for input(s): CHOL, HDL, LDLCALC, TRIG, CHOLHDL, LDLDIRECT in the last 72  hours. Thyroid function studies  Recent Labs  10/29/15 1921  TSH 1.943   Anemia work up  Recent Labs  10/29/15 1921  VITAMINB12 152*   Urinalysis    Component Value Date/Time   COLORURINE AMBER (A) 10/29/2015 1630   APPEARANCEUR CLOUDY (A) 10/29/2015 1630   LABSPEC 1.025 10/29/2015 1630   PHURINE 6.0 10/29/2015 1630   GLUCOSEU NEGATIVE 10/29/2015 1630   HGBUR LARGE (A) 10/29/2015 1630   BILIRUBINUR MODERATE (A) 10/29/2015 1630   KETONESUR 40 (A) 10/29/2015 1630  PROTEINUR 100 (A) 10/29/2015 1630   UROBILINOGEN 0.2 11/06/2012 2025   NITRITE NEGATIVE 10/29/2015 1630   LEUKOCYTESUR NEGATIVE 10/29/2015 1630   Sepsis Labs Invalid input(s): PROCALCITONIN,  WBC,  LACTICIDVEN Microbiology Recent Results (from the past 240 hour(s))  MRSA PCR Screening     Status: None   Collection Time: 10/29/15  7:00 PM  Result Value Ref Range Status   MRSA by PCR NEGATIVE NEGATIVE Final    Comment:        The GeneXpert MRSA Assay (FDA approved for NASAL specimens only), is one component of a comprehensive MRSA colonization surveillance program. It is not intended to diagnose MRSA infection nor to guide or monitor treatment for MRSA infections.   Culture, blood (routine x 2)     Status: None (Preliminary result)   Collection Time: 10/29/15  7:21 PM  Result Value Ref Range Status   Specimen Description BLOOD RIGHT HAND  Final   Special Requests BOTTLES DRAWN AEROBIC AND ANAEROBIC 6CC EACH  Final   Culture NO GROWTH < 24 HOURS  Final   Report Status PENDING  Incomplete  Culture, blood (routine x 2)     Status: None (Preliminary result)   Collection Time: 10/29/15  7:33 PM  Result Value Ref Range Status   Specimen Description RIGHT ANTECUBITAL  Final   Special Requests BOTTLES DRAWN AEROBIC AND ANAEROBIC 6CC EACH  Final   Culture NO GROWTH < 24 HOURS  Final   Report Status PENDING  Incomplete     SIGNED:   Mason Burleigh, Scheryl Marten, MD  [facility] 10/31/2015, 9:19 AM  If  7PM-7AM, please contact night-coverage [email or web address] Password [email or web address]

## 2015-10-31 NOTE — Progress Notes (Signed)
Alert and oriented. Vital signs stable. Saline lock removed. Telemetry D/C'ed. Discharge instructions given. No new prescriptions ordered. Patient verbalized understanding of instructions. Left floor via wheelchair with nursing staff and family member.  Discharged home.  Heather Roberts 10/31/2015 10:17 AM

## 2015-10-31 NOTE — Treatment Plan (Signed)
Patient seen. Full note to follow. This AM, patient much more appropriate. Pt is alert and oriented and has insight to his medical situation. Patient no longer involuntarily committed.

## 2015-11-02 LAB — DRUG SCREEN 10 W/CONF, SERUM
AMPHETAMINES, IA: NEGATIVE ng/mL
BENZODIAZEPINES, IA: NEGATIVE ng/mL
Barbiturates, IA: NEGATIVE ug/mL
COCAINE & METABOLITE, IA: NEGATIVE ng/mL
Methadone, IA: NEGATIVE ng/mL
OPIATES, IA: NEGATIVE ng/mL
Oxycodones, IA: NEGATIVE ng/mL
PHENCYCLIDINE, IA: NEGATIVE ng/mL
Propoxyphene, IA: NEGATIVE ng/mL
THC(Marijuana) Metabolite, IA: NEGATIVE ng/mL

## 2015-11-03 LAB — CULTURE, BLOOD (ROUTINE X 2)
CULTURE: NO GROWTH
Culture: NO GROWTH

## 2015-12-27 ENCOUNTER — Other Ambulatory Visit (HOSPITAL_COMMUNITY): Payer: Self-pay | Admitting: Internal Medicine

## 2015-12-27 DIAGNOSIS — M25539 Pain in unspecified wrist: Secondary | ICD-10-CM

## 2016-05-10 ENCOUNTER — Encounter: Payer: Self-pay | Admitting: Neurology

## 2016-05-10 ENCOUNTER — Ambulatory Visit (INDEPENDENT_AMBULATORY_CARE_PROVIDER_SITE_OTHER): Payer: Medicaid Other | Admitting: Neurology

## 2016-05-10 VITALS — BP 115/74 | HR 108 | Ht 74.0 in | Wt 205.3 lb

## 2016-05-10 DIAGNOSIS — R202 Paresthesia of skin: Secondary | ICD-10-CM | POA: Insufficient documentation

## 2016-05-10 DIAGNOSIS — R269 Unspecified abnormalities of gait and mobility: Secondary | ICD-10-CM

## 2016-05-10 HISTORY — DX: Unspecified abnormalities of gait and mobility: R26.9

## 2016-05-10 MED ORDER — TOPIRAMATE 25 MG PO TABS: ORAL_TABLET | ORAL | 3 refills | Status: DC

## 2016-05-10 NOTE — Patient Instructions (Signed)
We will check MRI of the cervical spine and start topamax 25 mg for the headache.  Topamax (topiramate) is a seizure medication that has an FDA approval for seizures and for migraine headache. Potential side effects of this medication include weight loss, cognitive slowing, tingling in the fingers and toes, and carbonated drinks will taste bad. If any significant side effects are noted on this drug, please contact our office.

## 2016-05-10 NOTE — Progress Notes (Signed)
Reason for visit: Headache  Referring physician: Dr. Paulla Schwartz is a 51 y.o. male  History of present illness:  [name] is a 51 year old right-handed white male with a history of chronic low back pain. The patient is on chronic opiate medications for this. He was admitted to the hospital at the end of August 2017 with altered mental status. The patient was also felt to have had opiate withdrawal as his urine drug screen was negative for opiate medications. The patient is also on benzodiazepine medications as well. The patient claims that since that time he has had a bifrontal headache going into the retro-orbital areas and he has had some numbness and tingly sensations down the left hand. MRI of the brain done during that admission showed no acute changes, minimal mild white matter changes were noted. The patient has been noted to have a low vitamin B12 level, he has been placed on vitamin B12 supplementation. The patient had been using a cane for a year or 2 prior to the admission in the summer of 2017. The patient has fallen on occasion, he believes that his gait problem has not progressed. He denies any issues controlling the bowels or the bladder. He denies any significant neck pain or neck stiffness. His headache problems are daily in nature. He does report some numbness in the feet. He is sent to this office for further evaluation.  Past Medical History:  Diagnosis Date  . Chronic back pain   . Gait abnormality 05/10/2016  . High cholesterol   . Hypertension     Past Surgical History:  Procedure Laterality Date  . BACK SURGERY      Family History  Problem Relation Age of Onset  . Stroke Brother   . Cancer Father     Social history:  reports that he has been smoking.  He has never used smokeless tobacco. He reports that he uses drugs, including Cocaine. He reports that he does not drink alcohol.  Medications:  Prior to Admission medications   Medication Sig  Start Date End Date Taking? Authorizing Provider  ALPRAZolam Prudy Feeler) 1 MG tablet Take 1-2 mg by mouth 3 (three) times daily as needed. Patient takes 1 tablet in the morning and 2 tablets at bedtime    Yes Historical Provider, MD  aspirin EC 81 MG tablet Take 81 mg by mouth daily.   Yes Historical Provider, MD  citalopram (CELEXA) 40 MG tablet Take 40 mg by mouth at bedtime. 09/01/15  Yes Historical Provider, MD  Cyanocobalamin (VITAMIN B-12 PO) Take 400 mcg by mouth daily.   Yes Historical Provider, MD  cyclobenzaprine (FLEXERIL) 10 MG tablet Take 10 mg by mouth 3 (three) times daily as needed for muscle spasms.   Yes Historical Provider, MD  fentaNYL (DURAGESIC - DOSED MCG/HR) 100 MCG/HR Place 1 patch onto the skin every 3 (three) days. 09/03/15  Yes Historical Provider, MD  halobetasol (ULTRAVATE) 0.05 % ointment Apply 1 application topically 2 (two) times daily.   Yes Historical Provider, MD  ibuprofen (ADVIL,MOTRIN) 800 MG tablet Take 800 mg by mouth daily as needed for pain.    Yes Historical Provider, MD  PRESCRIPTION MEDICATION Apply 1 application topically daily as needed (breakout). salacid 50% petroleum jelly   Yes Historical Provider, MD  zolpidem (AMBIEN) 10 MG tablet Take 10 mg by mouth at bedtime as needed for sleep.   Yes Historical Provider, MD  topiramate (TOPAMAX) 25 MG tablet Take one tablet at  night for one week, then take 2 tablets at night for one week, then take 3 tablets at night. 05/10/16   York Spaniel, MD     No Known Allergies  ROS:  Out of a complete 14 system review of symptoms, the patient complains only of the following symptoms, and all other reviewed systems are negative.  Swelling in the legs  Spinning sensations Blurred vision Eye pain Shortness of breath Constipation Impotence Increased thirst Joint pain, joint swelling, muscle cramps, aching muscles Confusion, weakness, slurred speech, dizziness, passing out, tremor Depression, anxiety, decreased  energy, change in appetite, racing thoughts Insomnia, restless legs  Blood pressure 115/74, pulse (!) 108, height 6\' 2"  (1.88 m), weight 205 lb 4.8 oz (93.1 kg).  Physical Exam  General: The patient is alert and cooperative at the time of the examination.  Eyes: Pupils are equal, round, and reactive to light. Discs are flat bilaterally.  Neck: The neck is supple, no carotid bruits are noted.  Respiratory: The respiratory examination is clear.  Cardiovascular: The cardiovascular examination reveals a regular rate and rhythm, no obvious murmurs or rubs are noted.  Skin: Extremities are without significant edema.  Neurologic Exam  Mental status: The patient is alert and oriented x 3 at the time of the examination. The patient has apparent normal recent and remote memory, with an apparently normal attention span and concentration ability.  Cranial nerves: Facial symmetry is present. There is good sensation of the face to pinprick and soft touch bilaterally. The strength of the facial muscles and the muscles to head turning and shoulder shrug are normal bilaterally. Speech is well enunciated, no aphasia or dysarthria is noted. Extraocular movements are full. Visual fields are full. The tongue is midline, and the patient has symmetric elevation of the soft palate. No obvious hearing deficits are noted.  Motor: The motor testing reveals 5 over 5 strength of all 4 extremities. Good symmetric motor tone is noted throughout.  Sensory: Sensory testing is intact to pinprick, soft touch, vibration sensation, and position sense on all 4 extremities. No evidence of extinction is noted.  Coordination: Cerebellar testing reveals good finger-nose-finger and heel-to-shin bilaterally.  Gait and station: Gait is minimally wide-based, the patient walks with a cane. Tandem gait is slightly unsteady.  Romberg is negative. No drift is seen.  Reflexes: Deep tendon reflexes are symmetric and normal  bilaterally, with exception of a depression of the left ankle jerk reflex. Toes are downgoing bilaterally.   MRI brain 10/30/15:  IMPRESSION: 1. No acute intracranial abnormality. 2. Mild cerebral white matter disease, nonspecific. Considerations include chronic small vessel ischemia, sequelae of trauma, hypercoagulable state, vasculitis, migraines, prior infection, and demyelination.  * MRI scan images were reviewed online. I agree with the written report.    Assessment/Plan:  1. Chronic gait disorder  2. Left arm paresthesias, headache  3. History of vitamin B12 deficiency   MRI of the brain that was done in September 2017 shows no explanation for the change in his gait stability or for the headaches. The patient will be sent for MRI of the cervical spine, he will be placed on Topamax for the headache. He will follow-up in about 4 months.    Marlan Palau MD 05/10/2016 5:01 PM  [facility] [address] [place], [place] [address]  Phone [phone] Fax [phone]

## 2016-05-14 ENCOUNTER — Telehealth: Payer: Self-pay | Admitting: Neurology

## 2016-05-14 NOTE — Telephone Encounter (Signed)
Tim Schwartz- are you able to still send orders to somewhere is [facility]?

## 2016-05-14 NOTE — Telephone Encounter (Signed)
Called and spoke with pt. Relayed Vickie Epley message below. Advised she will be calling to schedule him once she gets authorization through his insurance company. He verbalized understanding.

## 2016-05-14 NOTE — Telephone Encounter (Signed)
Pt would like to know if since he lives in [place], [place] can the MRI be done in [place], [place].  He was told that would be an option for him.

## 2016-05-14 NOTE — Telephone Encounter (Signed)
Yes he can have it at [facility].. Let me do the auth first and then I will contact the patient to schedule his MRI for [facility].Marland Kitchen

## 2016-05-17 ENCOUNTER — Telehealth: Payer: Self-pay | Admitting: Neurology

## 2016-05-17 NOTE — Telephone Encounter (Signed)
Medicaid did not approve the MRI- stating needs more clinical information. The case number is [id number] and the phone number for the peer to peer is [phone]. Thank you for your help!

## 2016-05-17 NOTE — Telephone Encounter (Signed)
The peer to peer call was done. The study was approved, the approval number and expiration date will be faxed ro our office.

## 2016-05-18 NOTE — Telephone Encounter (Signed)
Noted, thank you for your help!  °

## 2016-05-21 NOTE — Telephone Encounter (Signed)
He is scheduled at [facility] for 05/24/16 at 3:00 pm.

## 2016-05-24 ENCOUNTER — Telehealth: Payer: Self-pay | Admitting: Neurology

## 2016-05-24 ENCOUNTER — Ambulatory Visit (HOSPITAL_COMMUNITY)
Admission: RE | Admit: 2016-05-24 | Discharge: 2016-05-24 | Disposition: A | Discharge status: Home / Self Care | Payer: Medicaid Other | Source: Ambulatory Visit | Attending: Neurology | Admitting: Neurology

## 2016-05-24 DIAGNOSIS — R269 Unspecified abnormalities of gait and mobility: Secondary | ICD-10-CM | POA: Diagnosis present

## 2016-05-24 DIAGNOSIS — R202 Paresthesia of skin: Secondary | ICD-10-CM | POA: Insufficient documentation

## 2016-05-24 DIAGNOSIS — M4802 Spinal stenosis, cervical region: Secondary | ICD-10-CM | POA: Insufficient documentation

## 2016-05-24 NOTE — Telephone Encounter (Signed)
    MRI cervical 05/24/16:  IMPRESSION: Multilevel foraminal narrowing due to uncovertebral and facet hypertrophy as above. Changes most notable on the right with severe right C4 and moderate right C6 foraminal stenosis. Changes most notable left with moderate left C4, C6, and C7 foraminal stenosis. No significant spinal canal narrowing.

## 2016-05-24 NOTE — Telephone Encounter (Signed)
I called the patient. MRI of the cervical spine does not show evidence of spinal cord compression or anything that would impair his ability to ambulate. If he is having a lot of neck pain or discomfort down the left arm, epidural steroid injection may be considered.

## 2016-05-25 NOTE — Telephone Encounter (Signed)
I called the patient. The patient claims that he is not having a lot of neck discomfort or pain down from the neck into the arms, he is having numbness in both hands mainly involving the fourth and fifth fingers of the hands.  The patient will be set up for nerve conduction studies on both arms, we will do EMG evaluation on at least one arm.

## 2016-05-25 NOTE — Telephone Encounter (Signed)
Called patient back. Went over MRI results per CW,MD note. Advised CW,MD stated he could send him for [facility] if he is interested. Pt declined stating this is what caused previous back problems. He is wanting to know if there are other options.    He states his arm went numb during MRI exam. He is wondering what could have caused this since nothing was seen on MRI.  He is also wondering if any changes were seen on MRI.  Arthritis was previously seen on imaging per pt.   Advised I will send message to CW,MD to further advise.

## 2016-05-25 NOTE — Telephone Encounter (Signed)
Patient called office returning Dr. Willis' call in reference to MRI results.  Please call °

## 2016-05-25 NOTE — Addendum Note (Signed)
Addended by: York Spaniel on: 05/25/2016 01:47 PM   Modules accepted: Orders

## 2016-06-01 ENCOUNTER — Other Ambulatory Visit (HOSPITAL_COMMUNITY): Payer: Self-pay | Admitting: Internal Medicine

## 2016-06-01 DIAGNOSIS — R55 Syncope and collapse: Secondary | ICD-10-CM

## 2016-06-05 ENCOUNTER — Ambulatory Visit (HOSPITAL_COMMUNITY)
Admission: RE | Admit: 2016-06-05 | Discharge: 2016-06-05 | Disposition: A | Discharge status: Home / Self Care | Payer: Medicaid Other | Source: Ambulatory Visit | Attending: Internal Medicine | Admitting: Internal Medicine

## 2016-06-05 DIAGNOSIS — R55 Syncope and collapse: Secondary | ICD-10-CM

## 2016-06-05 DIAGNOSIS — I501 Left ventricular failure: Secondary | ICD-10-CM | POA: Insufficient documentation

## 2016-06-05 NOTE — Progress Notes (Signed)
*  PRELIMINARY RESULTS* Echocardiogram 2D Echocardiogram has been performed.  Jeryl Columbia 06/05/2016, 3:50 PM

## 2016-06-26 ENCOUNTER — Encounter: Payer: Medicaid Other | Admitting: Neurology

## 2016-08-17 ENCOUNTER — Other Ambulatory Visit: Payer: Self-pay | Admitting: Neurology

## 2017-11-18 IMAGING — MR MR CERVICAL SPINE W/O CM
4 of 5 series · 19 of 48 positions shown · non-contrast
Comparison: None available.

CLINICAL DATA: Initial evaluation for bilateral arm weakness and
numbness since [REDACTED] of this past year. History of prior stroke.

EXAM:
MRI CERVICAL SPINE WITHOUT CONTRAST
TECHNIQUE: Multiplanar, multisequence MR imaging of the cervical spine was
performed. No intravenous contrast was administered.

[Series 3: T2 · sagittal · 3.0mm · 0.41mm/px · 5 of 13 slices shown (1 of 2)]
[im 1/13]
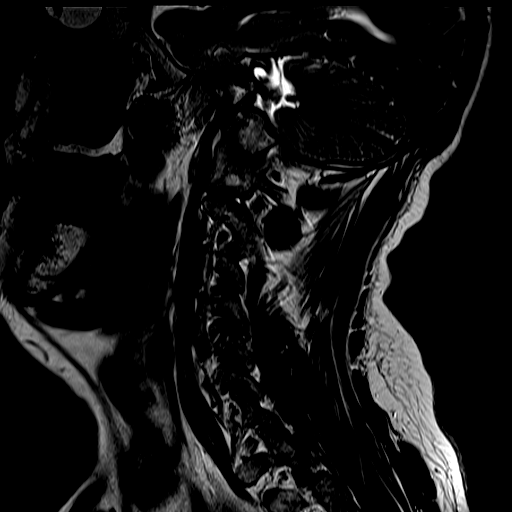
[im 4/13]
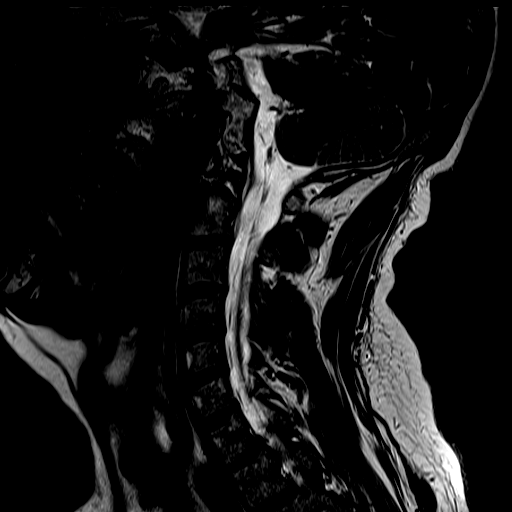
[im 7/13]
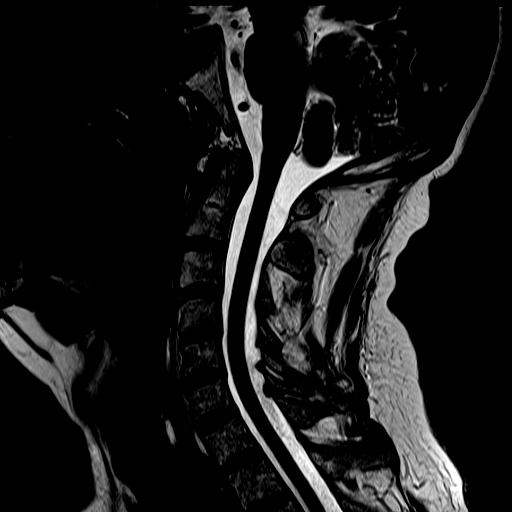
[im 10/13]
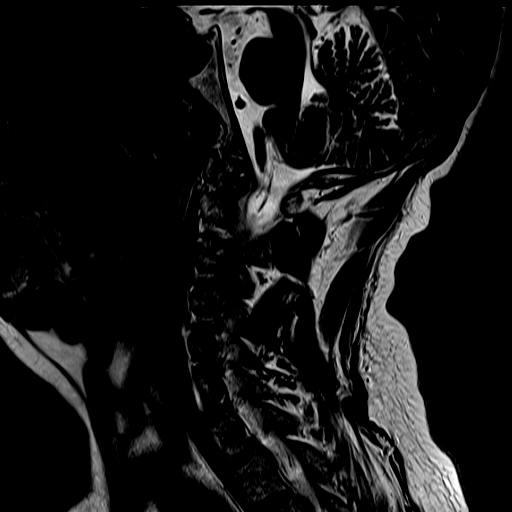
[im 13/13]
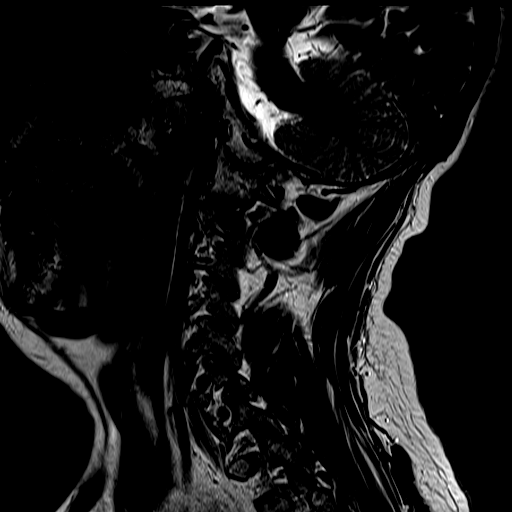

[Series 4: FLAIR · sagittal · 3.0mm · 0.43mm/px · 3 of 13 slices shown]
[im 1/13]
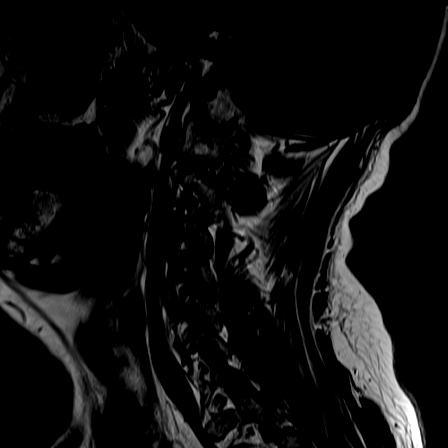
[im 7/13]
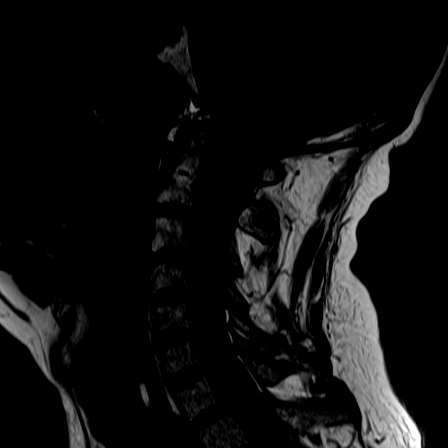
[im 13/13]
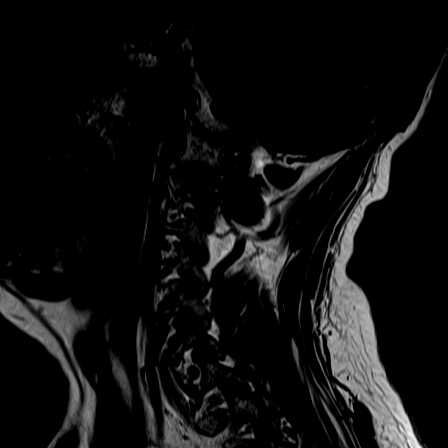

[Series 7: T2 · axial · 3.0mm · 0.19mm/px · z∈[-12,+79]mm · 8 of 36 slices shown (2 of 2)]
[im 3/36]
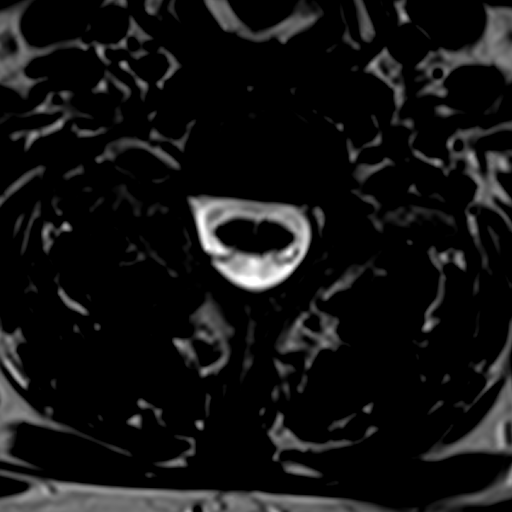
[im 5/36]
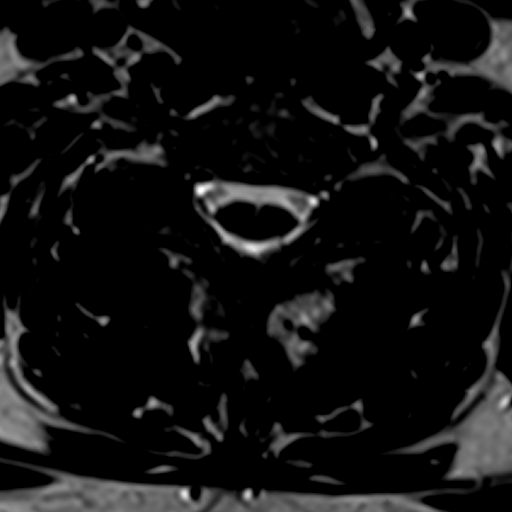
[im 8/36]
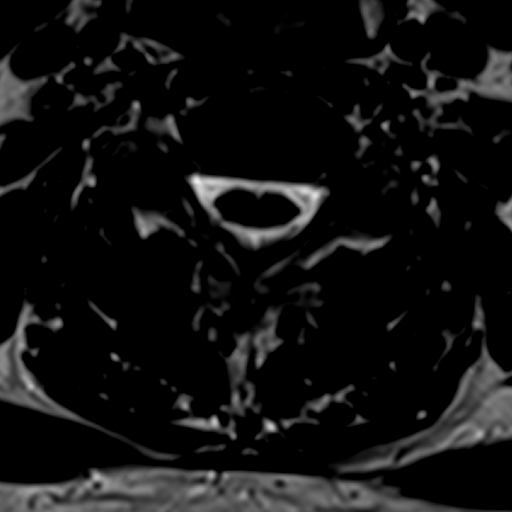
[im 12/36]
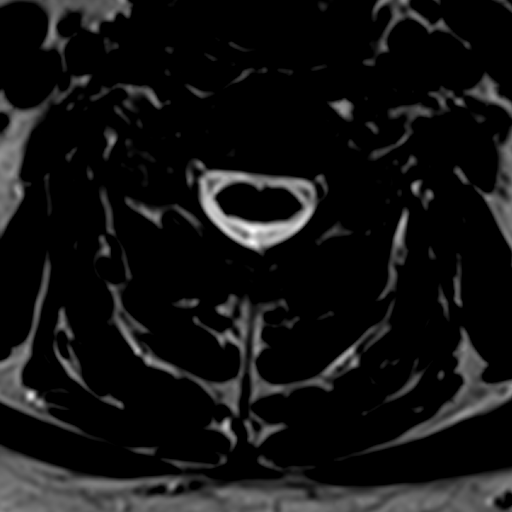
[im 17/36]
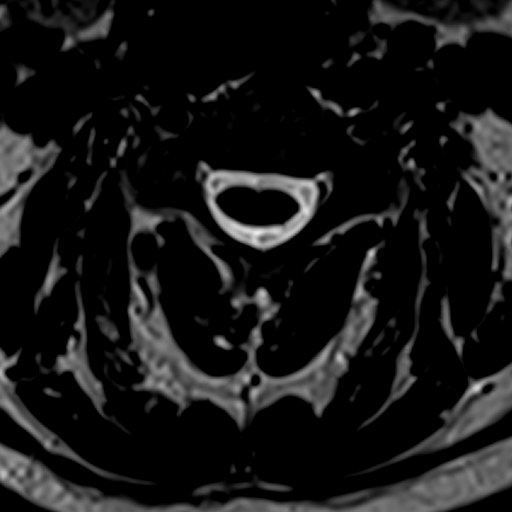
[im 19/36]
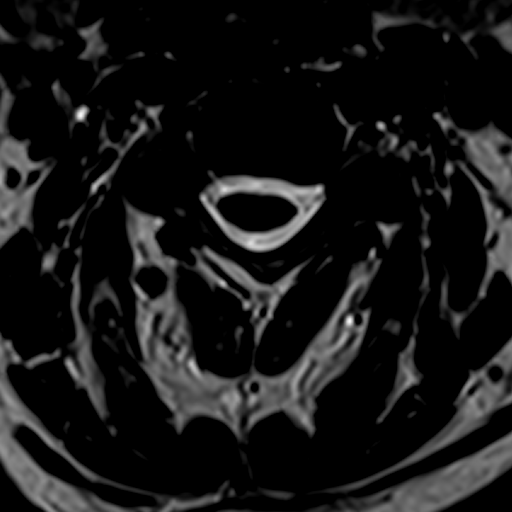
[im 22/36]
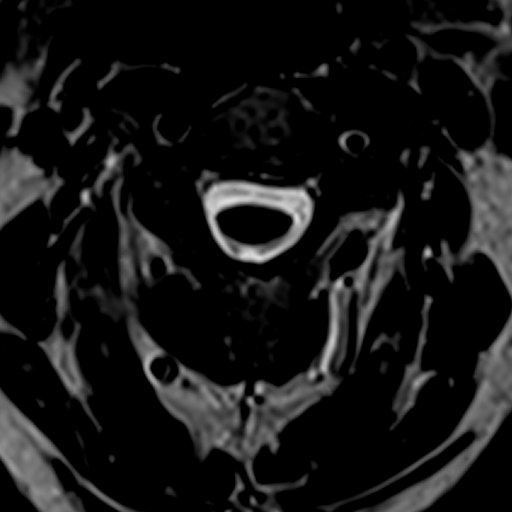
[im 31/36]
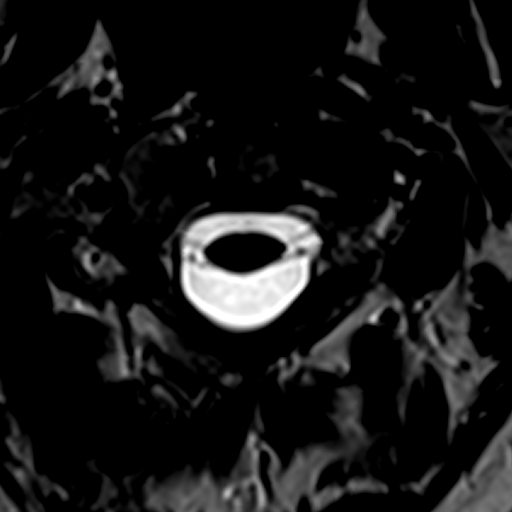

[Series 8: t2_medic · axial · 3.0mm · 0.23mm/px · z∈[-6,+79]mm · 3 of 36 slices shown]
[im 5/36]
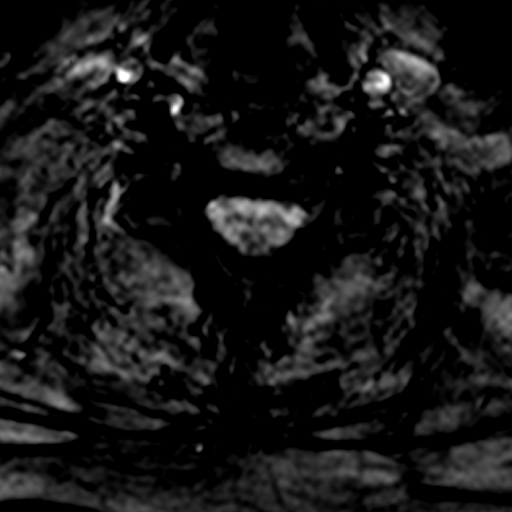
[im 19/36]
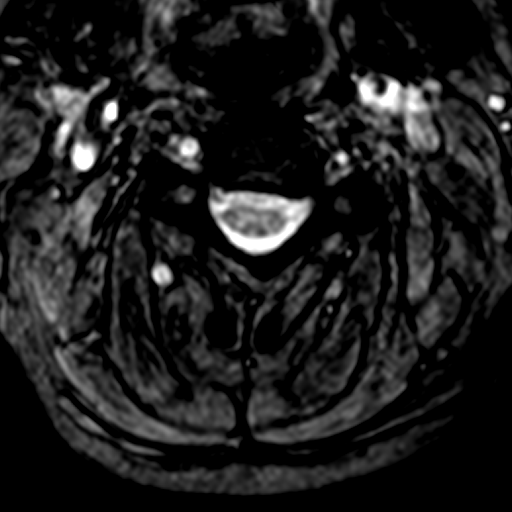
[im 31/36]
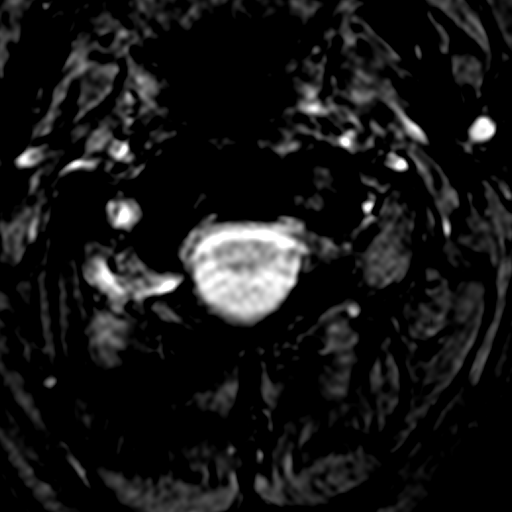

[19 of 48 positions shown; findings below may reference images not displayed]

FINDINGS: Alignment: Vertebral bodies normally aligned with preservation of
the normal cervical lordosis. No listhesis.

Vertebrae: Vertebral body heights are well maintained. No evidence
for acute or chronic fracture. Signal intensity within the vertebral
body bone marrow is normal. No focal osseous lesions. No abnormal
marrow edema.

Cord: Signal intensity within the cervical spinal cord is normal.

Posterior Fossa, vertebral arteries, paraspinal tissues: Visualized
brain and posterior fossa within normal limits. Craniocervical
junction normal. Paraspinous and prevertebral soft tissues normal.
Normal intravascular flow voids present within the vertebral
arteries bilaterally.

Disc levels:

C2-C3: Mild bilateral uncovertebral hypertrophy. No significant
stenosis.

C3-C4: Mild disc bulge with bilateral uncovertebral hypertrophy.
Mild bilateral facet hypertrophy. Resultant severe right with
moderate left C4 foraminal stenosis. Central canal widely patent.

C4-C5: Mild bilateral uncovertebral and facet hypertrophy. Mild
bilateral C5 foraminal stenosis. No significant canal narrowing.

C5-C6: Minimal disc bulge with bilateral uncovertebral hypertrophy.
Bilateral facet hypertrophy. Moderate bilateral C6 foraminal
stenosis, slightly worse on the left. No significant canal
narrowing.

C6-C7: Mild bilateral uncovertebral and facet hypertrophy. Resultant
moderate left with mild right C7 foraminal stenosis. No canal
narrowing.

C7-T1:  Unremarkable.

Visualized upper thoracic spine unremarkable.
IMPRESSION: Multilevel foraminal narrowing due to uncovertebral and facet
hypertrophy as above. Changes most notable on the right with severe
right C4 and moderate right C6 foraminal stenosis. Changes most
notable left with moderate left C4, C6, and C7 foraminal stenosis.
No significant spinal canal narrowing.

## 2017-12-03 ENCOUNTER — Other Ambulatory Visit: Payer: Self-pay

## 2017-12-03 DIAGNOSIS — M79604 Pain in right leg: Secondary | ICD-10-CM

## 2017-12-03 DIAGNOSIS — M79605 Pain in left leg: Principal | ICD-10-CM

## 2017-12-09 ENCOUNTER — Encounter: Payer: Self-pay | Admitting: Vascular Surgery

## 2017-12-09 ENCOUNTER — Ambulatory Visit (INDEPENDENT_AMBULATORY_CARE_PROVIDER_SITE_OTHER): Payer: Medicaid Other | Admitting: Vascular Surgery

## 2017-12-09 ENCOUNTER — Ambulatory Visit (HOSPITAL_COMMUNITY)
Admission: RE | Admit: 2017-12-09 | Discharge: 2017-12-09 | Disposition: A | Discharge status: Home / Self Care | Payer: Medicaid Other | Source: Ambulatory Visit | Attending: Vascular Surgery | Admitting: Vascular Surgery

## 2017-12-09 VITALS — BP 119/87 | HR 110 | Temp 98.0°F | Resp 20 | Ht 73.0 in | Wt 213.2 lb

## 2017-12-09 DIAGNOSIS — M79605 Pain in left leg: Secondary | ICD-10-CM | POA: Diagnosis not present

## 2017-12-09 DIAGNOSIS — M79604 Pain in right leg: Secondary | ICD-10-CM
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
## 2018-12-26 ENCOUNTER — Emergency Department (HOSPITAL_COMMUNITY): Payer: Medicaid Other

## 2018-12-26 ENCOUNTER — Other Ambulatory Visit: Payer: Self-pay

## 2018-12-26 ENCOUNTER — Emergency Department (HOSPITAL_COMMUNITY)
Admission: EM | Admit: 2018-12-26 | Discharge: 2018-12-26 | Disposition: A | Discharge status: Home / Self Care | Payer: Medicaid Other | Attending: Emergency Medicine | Admitting: Emergency Medicine

## 2018-12-26 ENCOUNTER — Encounter (HOSPITAL_COMMUNITY): Payer: Self-pay

## 2018-12-26 DIAGNOSIS — Y939 Activity, unspecified: Secondary | ICD-10-CM | POA: Insufficient documentation

## 2018-12-26 DIAGNOSIS — R945 Abnormal results of liver function studies: Secondary | ICD-10-CM | POA: Diagnosis not present

## 2018-12-26 DIAGNOSIS — W1839XA Other fall on same level, initial encounter: Secondary | ICD-10-CM | POA: Diagnosis not present

## 2018-12-26 DIAGNOSIS — R296 Repeated falls: Secondary | ICD-10-CM | POA: Insufficient documentation

## 2018-12-26 DIAGNOSIS — R42 Dizziness and giddiness: Secondary | ICD-10-CM | POA: Insufficient documentation

## 2018-12-26 DIAGNOSIS — Y999 Unspecified external cause status: Secondary | ICD-10-CM | POA: Diagnosis not present

## 2018-12-26 DIAGNOSIS — S92512A Displaced fracture of proximal phalanx of left lesser toe(s), initial encounter for closed fracture: Secondary | ICD-10-CM | POA: Diagnosis not present

## 2018-12-26 DIAGNOSIS — Y929 Unspecified place or not applicable: Secondary | ICD-10-CM | POA: Diagnosis not present

## 2018-12-26 DIAGNOSIS — Z8673 Personal history of transient ischemic attack (TIA), and cerebral infarction without residual deficits: Secondary | ICD-10-CM | POA: Insufficient documentation

## 2018-12-26 DIAGNOSIS — R739 Hyperglycemia, unspecified: Secondary | ICD-10-CM | POA: Diagnosis present

## 2018-12-26 DIAGNOSIS — I10 Essential (primary) hypertension: Secondary | ICD-10-CM | POA: Insufficient documentation

## 2018-12-26 DIAGNOSIS — R7989 Other specified abnormal findings of blood chemistry: Secondary | ICD-10-CM

## 2018-12-26 HISTORY — DX: Repeated falls: R29.6

## 2018-12-26 HISTORY — DX: Unspecified fall, initial encounter: W19.XXXA

## 2018-12-26 HISTORY — DX: Cerebral infarction, unspecified: I63.9

## 2018-12-26 LAB — PHOSPHORUS: Phosphorus: 3.4 mg/dL (ref 2.5–4.6)

## 2018-12-26 LAB — COMPREHENSIVE METABOLIC PANEL
ALT: 170 U/L — ABNORMAL HIGH (ref 0–44)
AST: 92 U/L — ABNORMAL HIGH (ref 15–41)
Albumin: 3.6 g/dL (ref 3.5–5.0)
Alkaline Phosphatase: 232 U/L — ABNORMAL HIGH (ref 38–126)
Anion gap: 11 (ref 5–15)
BUN: 8 mg/dL (ref 6–20)
CO2: 30 mmol/L (ref 22–32)
Calcium: 8.9 mg/dL (ref 8.9–10.3)
Chloride: 103 mmol/L (ref 98–111)
Creatinine, Ser: 0.9 mg/dL (ref 0.61–1.24)
GFR calc Af Amer: >60 mL/min (ref >60)
GFR calc non Af Amer: >60 mL/min (ref >60)
Glucose, Bld: 84 mg/dL (ref 70–99)
Potassium: 3.1 mmol/L — ABNORMAL LOW (ref 3.5–5.1)
Sodium: 144 mmol/L (ref 135–145)
Total Bilirubin: 1.4 mg/dL — ABNORMAL HIGH (ref 0.3–1.2)
Total Protein: 8.3 g/dL — ABNORMAL HIGH (ref 6.5–8.1)

## 2018-12-26 LAB — URINALYSIS, ROUTINE W REFLEX MICROSCOPIC
Bilirubin Urine: NEGATIVE
Glucose, UA: NEGATIVE mg/dL
Hgb urine dipstick: NEGATIVE
Ketones, ur: NEGATIVE mg/dL
Leukocytes,Ua: NEGATIVE
Nitrite: NEGATIVE
Protein, ur: NEGATIVE mg/dL
Specific Gravity, Urine: 1.013 (ref 1.005–1.030)
pH: 6 (ref 5.0–8.0)

## 2018-12-26 LAB — CBC
HCT: 56.3 % — ABNORMAL HIGH (ref 39.0–52.0)
Hemoglobin: 18 g/dL — ABNORMAL HIGH (ref 13.0–17.0)
MCH: 31.7 pg (ref 26.0–34.0)
MCHC: 32 g/dL (ref 30.0–36.0)
MCV: 99.3 fL (ref 80.0–100.0)
Platelets: 158 10*3/uL (ref 150–400)
RBC: 5.67 MIL/uL (ref 4.22–5.81)
RDW: 14.4 % (ref 11.5–15.5)
WBC: 11.6 10*3/uL — ABNORMAL HIGH (ref 4.0–10.5)
nRBC: 0 % (ref 0.0–0.2)

## 2018-12-26 LAB — MAGNESIUM: Magnesium: 2.2 mg/dL (ref 1.7–2.4)

## 2018-12-26 LAB — CBG MONITORING, ED: Glucose-Capillary: 72 mg/dL (ref 70–99)

## 2018-12-26 MED ORDER — SODIUM CHLORIDE 0.9 % IV BOLUS
1000.0000 mL | Freq: Once | INTRAVENOUS | Status: AC
  Administered 2018-12-26: 1000 mL via INTRAVENOUS

## 2018-12-26 MED ORDER — ONDANSETRON HCL 4 MG/2ML IJ SOLN
4.0000 mg | Freq: Once | INTRAMUSCULAR | Status: AC
  Administered 2018-12-26: 4 mg via INTRAVENOUS
  Filled 2018-12-26: qty 2

## 2018-12-26 MED ORDER — MECLIZINE HCL 12.5 MG PO TABS
25.0000 mg | ORAL_TABLET | Freq: Once | ORAL | Status: AC
  Administered 2018-12-26: 25 mg via ORAL
  Filled 2018-12-26: qty 2

## 2018-12-26 MED ORDER — MECLIZINE HCL 25 MG PO TABS: 25.0000 mg | ORAL_TABLET | Freq: Three times a day (TID) | ORAL | 0 refills | Status: AC | PRN

## 2018-12-26 NOTE — ED Triage Notes (Signed)
Pt reports that he has had several falls recently. Last fall yesterday states he just walks a few steps and fall. Checked BS 380. Michela Pitcher he is suppose to be on fentanyl patches but he is not and may be in withdrawal.

## 2018-12-26 NOTE — ED Notes (Signed)
From Rad 

## 2018-12-26 NOTE — Discharge Instructions (Signed)
Continue taking home medications as prescribed. It is important that you stop using heroin, as this is likely affecting your dizziness and your liver. Make sure you are staying well-hydrated water. Follow-up with your primary care doctor for further evaluation of dizziness and frequent falls. Use meclizine as needed for dizziness. Return to the ER with any new, worsening, or concerning symptoms.

## 2018-12-26 NOTE — ED Notes (Signed)
To Rad  Mother to room

## 2018-12-26 NOTE — ED Notes (Signed)
Urine spec to lab 

## 2018-12-26 NOTE — ED Notes (Signed)
Pt reports he is out of his fent patches as provider was cutting his doses  He was given oxycodone "but medicaid would not approve it and I took 200 dollars out of my pocket" and could only get a portion of the script

## 2018-12-26 NOTE — ED Notes (Signed)
PA at bedside.

## 2018-12-26 NOTE — ED Notes (Signed)
Pt complaint of N  PA informed

## 2018-12-26 NOTE — ED Provider Notes (Signed)
[facility] EMERGENCY DEPARTMENT Provider Note   CSN: [id number] Arrival date & time: 12/26/18  0930     History   Chief Complaint Chief Complaint  Patient presents with  . Hyperglycemia  . Fall    HPI Tim Schwartz is a 53 y.o. male presenting for evaluation of dizziness, frequent falls, and hyperglycemia.  Patient states of the past several months, he has been falling frequently.  He states on average he falls once a day.  Last fell 2 days ago.  He states he falls because he is dizzy.  He describes this as feeling off balance.  Dizziness is worse when he goes from sitting to standing, although is present even at rest.  He denies hitting his head or loss of consciousness.  He is not on blood thinners. When he fell last week, he injured his L lateral toes. Pain is worse with movement and weightbearing.  He has chronic pain, recently his fentanyl patch was weaned down to a lower dose.  As such, he has started using heroin.  Last use last week.  He denies fevers, chills, chest pain, shortness of breath, nausea, vomiting, abdominal pain.  Patient states he is having dysuria and urinary frequency, no hematuria.  He is having normal bowel movements.  Patient reports decreased appetite, states he has lost 20 pounds in the past several months.  Patient reports he quit alcohol 20 years ago.  Smokes a pack of cigarettes a week.  Denies other drug use.     HPI  Past Medical History:  Diagnosis Date  . Chronic back pain   . Falls   . Gait abnormality 05/10/2016  . High cholesterol   . Hypertension   . Stroke [facility])     Patient Active Problem List   Diagnosis Date Noted  . Paresthesia 05/10/2016  . Gait abnormality 05/10/2016  . Opioid use with withdrawal (HCC) 10/31/2015  . Altered mental status 10/29/2015  . Hypokalemia 10/29/2015  . Anxiety state 10/29/2015  . Hypertension   . Chronic back pain   . High cholesterol     Past Surgical History:  Procedure Laterality Date  .  BACK SURGERY          Home Medications    Prior to Admission medications   Medication Sig Start Date End Date Taking? Authorizing Provider  ALPRAZolam Prudy Feeler) 1 MG tablet Take 1 mg by mouth every 6 (six) hours. Patient takes 1 tablet in the morning and 2 tablets at bedtime    Yes [provider]  citalopram (CELEXA) 40 MG tablet Take 40 mg by mouth at bedtime. 09/01/15  Yes [provider]  ibuprofen (ADVIL,MOTRIN) 800 MG tablet Take 800 mg by mouth daily as needed for pain.    Yes [provider]  topiramate (TOPAMAX) 25 MG tablet Take one tablet at night for one week, then take 2 tablets at night for one week, then take 3 tablets at night. Patient taking differently: Take 25 mg by mouth daily. Take one tablet at night for one week, then take 2 tablets at night for one week, then take 3 tablets at night. 05/10/16  Yes York Spaniel, MD  aspirin EC 81 MG tablet Take 81 mg by mouth daily.    [provider]  Cyanocobalamin (VITAMIN B-12 PO) Take 400 mcg by mouth daily.    [provider]  cyclobenzaprine (FLEXERIL) 10 MG tablet Take 10 mg by mouth 3 (three) times daily as needed for muscle spasms.  [provider]  fentaNYL (DURAGESIC - DOSED MCG/HR) 100 MCG/HR Place 1 patch onto the skin every 3 (three) days. 09/03/15   [provider]  halobetasol (ULTRAVATE) 0.05 % ointment Apply 1 application topically 2 (two) times daily.    [provider]  meclizine (ANTIVERT) 25 MG tablet Take 1 tablet (25 mg total) by mouth 3 (three) times daily as needed for dizziness. 12/26/18   Ovidio Steele, PA-C  PRESCRIPTION MEDICATION Apply 1 application topically daily as needed (breakout). salacid 50% petroleum jelly    [provider]  zolpidem (AMBIEN) 10 MG tablet Take 10 mg by mouth at bedtime as needed for sleep.    [provider]    Family History Family History  Problem Relation Age of Onset  . Stroke  Brother   . Cancer Father     Social History Social History   Tobacco Use  . Smoking status: Current Every Day Smoker    Packs/day: 1.00    Types: Cigarettes  . Smokeless tobacco: Never Used  Substance Use Topics  . Alcohol use: No  . Drug use: Not Currently    Types: Cocaine    Comment: former drug use     Allergies   Patient has no known allergies.   Review of Systems Review of Systems  Constitutional: Positive for appetite change and unexpected weight change.  Genitourinary: Positive for dysuria and frequency.  Musculoskeletal: Positive for arthralgias and back pain (chronic).  Neurological: Positive for dizziness.  All other systems reviewed and are negative.    Physical Exam Updated Vital Signs BP 135/73   Pulse 76   Temp 98.2 F (36.8 C) (Oral)   Resp 11   SpO2 100%   Physical Exam Vitals signs and nursing note reviewed.  Constitutional:      General: He is not in acute distress.    Appearance: He is well-developed.     Comments: Appears nontoxic  HENT:     Head: Normocephalic and atraumatic.  Eyes:     Extraocular Movements: Extraocular movements intact.     Conjunctiva/sclera: Conjunctivae normal.     Pupils: Pupils are equal, round, and reactive to light.     Comments: EOMI and PERRLA. No nystagmus  Neck:     Musculoskeletal: Normal range of motion and neck supple.  Cardiovascular:     Rate and Rhythm: Normal rate and regular rhythm.     Pulses: Normal pulses.  Pulmonary:     Effort: Pulmonary effort is normal. No respiratory distress.     Breath sounds: Normal breath sounds. No wheezing.  Abdominal:     General: There is no distension.     Palpations: Abdomen is soft. There is no mass.     Tenderness: There is no abdominal tenderness. There is no guarding or rebound.     Comments: No tenderness palpation the abdomen.  Soft without rigidity, guarding, distention.  Negative rebound.  Musculoskeletal: Normal range of motion.     Comments:  Strength and sensation intact x4.  No lower leg swelling.  Skin:    General: Skin is warm and dry.     Capillary Refill: Capillary refill takes less than 2 seconds.  Neurological:     General: No focal deficit present.     Mental Status: He is alert and oriented to person, place, and time.     GCS: GCS eye subscore is 4. GCS verbal subscore is 5. GCS motor subscore is 6.     Cranial Nerves:  Cranial nerves are intact.     Sensory: Sensation is intact.     Motor: Motor function is intact.     Coordination: Coordination is intact.     Gait: Gait is intact.     Comments: No neurologic deficits.  CN intact.  Nose to finger intact.  Fine movement and coordination intact.  Ambulatory without ataxic or abnormal gait.  Patient did not need assistance with ambulation.      ED Treatments / Results  Labs (all labs ordered are listed, but only abnormal results are displayed) Labs Reviewed  CBC - Abnormal; Notable for the following components:      Result Value   WBC 11.6 (*)    Hemoglobin 18.0 (*)    HCT 56.3 (*)    All other components within normal limits  COMPREHENSIVE METABOLIC PANEL - Abnormal; Notable for the following components:   Potassium 3.1 (*)    Total Protein 8.3 (*)    AST 92 (*)    ALT 170 (*)    Alkaline Phosphatase 232 (*)    Total Bilirubin 1.4 (*)    All other components within normal limits  URINALYSIS, ROUTINE W REFLEX MICROSCOPIC - Abnormal; Notable for the following components:   Color, Urine AMBER (*)    All other components within normal limits  MAGNESIUM  PHOSPHORUS  CBG MONITORING, ED    EKG EKG Interpretation  Date/Time:  Friday December 26 2018 10:58:43 EDT Ventricular Rate:  77 PR Interval:    QRS Duration: 92 QT Interval:  417 QTC Calculation: 472 R Axis:   -51 Text Interpretation: Sinus rhythm Abnormal R-wave progression, early transition Inferior infarct, old since last tracing no significant change Confirmed by Noemi Chapel [id number]) on  12/26/2018 11:04:14 AM   Radiology Ct Head Wo Contrast  Result Date: 12/26/2018 CLINICAL DATA:  Several recent falls.  Ataxia. EXAM: CT HEAD WITHOUT CONTRAST TECHNIQUE: Contiguous axial images were obtained from the base of the skull through the vertex without intravenous contrast. COMPARISON:  October 12, 2018 FINDINGS: Brain: The ventricles and sulci appear stable with mild degree of diffuse atrophy. There is no intracranial mass, hemorrhage, extra-axial fluid collection, or midline shift. There is evidence of a prior focal infarct in the white matter of the anterior mid left centrum semiovale, also present previously. Elsewhere there is slight small vessel disease in the centra semiovale bilaterally. No acute appearing infarct evident on this study. Vascular: There is no hyperdense vessel. There is no appreciable vascular calcification. Skull: The bony calvarium appears intact. Sinuses/Orbits: There is mucosal thickening in multiple ethmoid air cells. Other visualized paranasal sinuses are clear. Orbits appear symmetric bilaterally. Other: Mastoids on the right are clear. There is opacification in an inferior mastoid air cell on the left. IMPRESSION: Mild atrophy with mild periventricular small vessel disease. Prior focal infarct in the white matter of the left anterior centrum semiovale. No acute infarct. No mass or hemorrhage. Mucosal thickening noted in multiple ethmoid air cells. Mild opacification in inferior mastoid air cell on the left. Electronically Signed   By: Lowella Grip III M.D.   On: 12/26/2018 11:59   Dg Foot Complete Left  Result Date: 12/26/2018 CLINICAL DATA:  Pain.  Several recent falls EXAM: LEFT FOOT - COMPLETE 3+ VIEW COMPARISON:  None. FINDINGS: Frontal, oblique, and lateral views were obtained. There is an obliquely oriented fracture through the midportion of the fifth proximal phalanx with slight volar angulation distally. No other fracture. No dislocation. There is  flexion of  the second, third, fourth, and fifth PIP and DIP joints. No appreciable joint space narrowing. No erosive changes. There are small inferior and posterior calcaneal spurs. IMPRESSION: Fracture of the midportion of the fifth proximal phalanx with mild volar displacement distally. No other acute fracture. No dislocation. No appreciable joint space narrowing or erosion. Small calcaneal spurs. Electronically Signed   By: Bretta Bang III M.D.   On: 12/26/2018 11:37    Procedures Procedures (including critical care time)  Medications Ordered in ED Medications  ondansetron (ZOFRAN) injection 4 mg (4 mg Intravenous Given 12/26/18 1216)  sodium chloride 0.9 % bolus 1,000 mL (0 mLs Intravenous Stopped 12/26/18 1319)  meclizine (ANTIVERT) tablet 25 mg (25 mg Oral Given 12/26/18 1216)     Initial Impression / Assessment and Plan / ED Course  I have reviewed the triage vital signs and the nursing notes.  Pertinent labs & imaging results that were available during my care of the patient were reviewed by me and considered in my medical decision making (see chart for details).        Patient presenting for evaluation of several months of frequent falls.  Also reporting decreased appetite, weight changes, dizziness, and left foot pain.  Will obtain labs, urine, CT head, and x-ray of the foot for further evaluation.  As patient is without focal neurologic deficit, low suspicion for acute stroke.  EKG without change from previous.  X-ray viewed interpreted by me, shows fracture of the proximal phalanx of the fifth digit with mild displacement.  CT head negative for acute findings, shows old centrum semiovale lesion.  Labs overall reassuring.  Mild leukocytosis at 11.6, nonspecific.  Slightly hemoconcentrated at 18, will give fluids.  Liver enzymes elevated.  Consider hepatitis due to recent drug use vs viral cause vs medication induced. In the setting of decreased appetite and weight changes,  will have pt f/u with GI.   Urine without infection.  On reassessment after fluids, Zofran, meclizine, patient reports symptoms are improved.  He remains neurologically intact.  Discussed follow-up with primary care doctor for further evaluation of frequent falls and follow-up with GI for evaluation of LFTs.  At this time, patient appears safe for discharge.  Return precautions given.  Patient states he understands and agrees to plan.  Final Clinical Impressions(s) / ED Diagnoses   Final diagnoses:  Displaced fracture of proximal phalanx of left lesser toe(s), initial encounter for closed fracture  Frequent falls  Elevated LFTs    ED Discharge Orders         Ordered    meclizine (ANTIVERT) 25 MG tablet  3 times daily PRN     12/26/18 1351           Caelynn Marshman, PA-C 12/26/18 1627    Eber Hong, MD 12/27/18 [phone]

## 2019-02-09 ENCOUNTER — Inpatient Hospital Stay (HOSPITAL_COMMUNITY)
Admission: EM | Admit: 2019-02-09 | Discharge: 2019-02-11 | DRG: 312 | Disposition: A | Discharge status: Home / Self Care | Payer: Medicaid Other | Attending: Internal Medicine | Admitting: Internal Medicine

## 2019-02-09 ENCOUNTER — Emergency Department (HOSPITAL_COMMUNITY): Payer: Medicaid Other

## 2019-02-09 DIAGNOSIS — M549 Dorsalgia, unspecified: Secondary | ICD-10-CM | POA: Diagnosis present

## 2019-02-09 DIAGNOSIS — Z20828 Contact with and (suspected) exposure to other viral communicable diseases: Secondary | ICD-10-CM | POA: Diagnosis present

## 2019-02-09 DIAGNOSIS — F419 Anxiety disorder, unspecified: Secondary | ICD-10-CM | POA: Diagnosis present

## 2019-02-09 DIAGNOSIS — E860 Dehydration: Secondary | ICD-10-CM | POA: Diagnosis present

## 2019-02-09 DIAGNOSIS — I110 Hypertensive heart disease with heart failure: Secondary | ICD-10-CM | POA: Diagnosis present

## 2019-02-09 DIAGNOSIS — I69398 Other sequelae of cerebral infarction: Secondary | ICD-10-CM

## 2019-02-09 DIAGNOSIS — G43909 Migraine, unspecified, not intractable, without status migrainosus: Secondary | ICD-10-CM | POA: Diagnosis present

## 2019-02-09 DIAGNOSIS — W19XXXA Unspecified fall, initial encounter: Secondary | ICD-10-CM | POA: Diagnosis not present

## 2019-02-09 DIAGNOSIS — F17210 Nicotine dependence, cigarettes, uncomplicated: Secondary | ICD-10-CM | POA: Diagnosis present

## 2019-02-09 DIAGNOSIS — Z79891 Long term (current) use of opiate analgesic: Secondary | ICD-10-CM | POA: Diagnosis not present

## 2019-02-09 DIAGNOSIS — G8929 Other chronic pain: Secondary | ICD-10-CM | POA: Diagnosis present

## 2019-02-09 DIAGNOSIS — K219 Gastro-esophageal reflux disease without esophagitis: Secondary | ICD-10-CM | POA: Diagnosis not present

## 2019-02-09 DIAGNOSIS — M21372 Foot drop, left foot: Secondary | ICD-10-CM | POA: Diagnosis present

## 2019-02-09 DIAGNOSIS — R55 Syncope and collapse: Principal | ICD-10-CM | POA: Diagnosis present

## 2019-02-09 DIAGNOSIS — G894 Chronic pain syndrome: Secondary | ICD-10-CM

## 2019-02-09 DIAGNOSIS — F329 Major depressive disorder, single episode, unspecified: Secondary | ICD-10-CM | POA: Diagnosis present

## 2019-02-09 DIAGNOSIS — Z79899 Other long term (current) drug therapy: Secondary | ICD-10-CM

## 2019-02-09 DIAGNOSIS — I5032 Chronic diastolic (congestive) heart failure: Secondary | ICD-10-CM | POA: Diagnosis present

## 2019-02-09 DIAGNOSIS — E876 Hypokalemia: Secondary | ICD-10-CM | POA: Diagnosis present

## 2019-02-09 DIAGNOSIS — Z7982 Long term (current) use of aspirin: Secondary | ICD-10-CM | POA: Diagnosis not present

## 2019-02-09 DIAGNOSIS — R269 Unspecified abnormalities of gait and mobility: Secondary | ICD-10-CM | POA: Diagnosis present

## 2019-02-09 DIAGNOSIS — Z823 Family history of stroke: Secondary | ICD-10-CM

## 2019-02-09 DIAGNOSIS — R7401 Elevation of levels of liver transaminase levels: Secondary | ICD-10-CM | POA: Diagnosis present

## 2019-02-09 DIAGNOSIS — F32A Depression, unspecified: Secondary | ICD-10-CM

## 2019-02-09 LAB — CBC WITH DIFFERENTIAL/PLATELET
Abs Immature Granulocytes: 0.15 K/uL — ABNORMAL HIGH (ref 0.00–0.07)
Basophils Absolute: 0 K/uL (ref 0.0–0.1)
Basophils Relative: 0 %
Eosinophils Absolute: 0 K/uL (ref 0.0–0.5)
Eosinophils Relative: 0 %
HCT: 50.8 % (ref 39.0–52.0)
Hemoglobin: 17.2 g/dL — ABNORMAL HIGH (ref 13.0–17.0)
Immature Granulocytes: 1 %
Lymphocytes Relative: 6 %
Lymphs Abs: 1.3 K/uL (ref 0.7–4.0)
MCH: 32.1 pg (ref 26.0–34.0)
MCHC: 33.9 g/dL (ref 30.0–36.0)
MCV: 95 fL (ref 80.0–100.0)
Monocytes Absolute: 1 K/uL (ref 0.1–1.0)
Monocytes Relative: 5 %
Neutro Abs: 18.5 K/uL — ABNORMAL HIGH (ref 1.7–7.7)
Neutrophils Relative %: 88 %
Platelets: 254 K/uL (ref 150–400)
RBC: 5.35 MIL/uL (ref 4.22–5.81)
RDW: 13.8 % (ref 11.5–15.5)
WBC: 21 K/uL — ABNORMAL HIGH (ref 4.0–10.5)
nRBC: 0 % (ref 0.0–0.2)

## 2019-02-09 LAB — COMPREHENSIVE METABOLIC PANEL WITH GFR
ALT: 147 U/L — ABNORMAL HIGH (ref 0–44)
AST: 81 U/L — ABNORMAL HIGH (ref 15–41)
Albumin: 3.8 g/dL (ref 3.5–5.0)
Alkaline Phosphatase: 178 U/L — ABNORMAL HIGH (ref 38–126)
Anion gap: 10 (ref 5–15)
BUN: 23 mg/dL — ABNORMAL HIGH (ref 6–20)
CO2: 25 mmol/L (ref 22–32)
Calcium: 8.8 mg/dL — ABNORMAL LOW (ref 8.9–10.3)
Chloride: 102 mmol/L (ref 98–111)
Creatinine, Ser: 1.02 mg/dL (ref 0.61–1.24)
GFR calc Af Amer: >60 mL/min (ref >60)
GFR calc non Af Amer: >60 mL/min (ref >60)
Glucose, Bld: 132 mg/dL — ABNORMAL HIGH (ref 70–99)
Potassium: 2.8 mmol/L — ABNORMAL LOW (ref 3.5–5.1)
Sodium: 137 mmol/L (ref 135–145)
Total Bilirubin: 1.9 mg/dL — ABNORMAL HIGH (ref 0.3–1.2)
Total Protein: 8.2 g/dL — ABNORMAL HIGH (ref 6.5–8.1)

## 2019-02-09 LAB — LACTIC ACID, PLASMA
Lactic Acid, Venous: 2.9 mmol/L (ref 0.5–1.9)
Lactic Acid, Venous: 2 mmol/L (ref 0.5–1.9)

## 2019-02-09 LAB — CK: Total CK: 209 U/L (ref 49–397)

## 2019-02-09 LAB — POC SARS CORONAVIRUS 2 AG -  ED: SARS Coronavirus 2 Ag: NEGATIVE

## 2019-02-09 LAB — TROPONIN I (HIGH SENSITIVITY)
Troponin I (High Sensitivity): 44 ng/L — ABNORMAL HIGH (ref <18)
Troponin I (High Sensitivity): 46 ng/L — ABNORMAL HIGH (ref <18)

## 2019-02-09 LAB — AMMONIA: Ammonia: 16 umol/L (ref 9–35)

## 2019-02-09 LAB — ETHANOL: Alcohol, Ethyl (B): <10 mg/dL (ref <10)

## 2019-02-09 MED ORDER — SODIUM CHLORIDE 0.9 % IV BOLUS
1000.0000 mL | Freq: Once | INTRAVENOUS | Status: AC
  Administered 2019-02-09: 22:00:00 1000 mL via INTRAVENOUS

## 2019-02-09 MED ORDER — SODIUM CHLORIDE 0.9 % IV BOLUS
1000.0000 mL | Freq: Once | INTRAVENOUS | Status: AC
  Administered 2019-02-09: 1000 mL via INTRAVENOUS

## 2019-02-09 NOTE — H&P (Signed)
Tim H&P    Patient Demographics:    Tim Schwartz, is a 53 y.o. male  MRN: [id number]  DOB - 01/28/1966  Admit Date - 02/09/2019  Referring MD/NP/PA: Veryl Speak  Outpatient Primary MD for the patient is [name], MD  Patient coming from: Home  Chief complaint-fall   HPI:    Tim Schwartz  is a 53 y.o. male,*with history of chronic back pain, gait abnormality, paresthesia, hypertension, chronic opioid use was brought to hospital by EMS after patient's mother found him on the ground unresponsive.  Patient lives in a small shed behind his mother's house.  When she went to check on him he was found laying on the ground and having defecated on himself.  EMS was called and patient was transported to the ED for further evaluation.  Patient is a poor historian, he is choosing not to answer my questions though he is alert and responding to my questions by head nodding. He denies chest pain or shortness of breath. Does not remember whether he passed out or not. Denies abdominal pain or dysuria. Unfortunately no other history is obtainable as patient is not very cooperative at this time.    Review of systems:    As in HPI, rest of the review of systems not obtainable.   Past History of the following :    Past Medical History:  Diagnosis Date  . Chronic back pain   . Falls   . Gait abnormality 05/10/2016  . High cholesterol   . Hypertension   . Stroke [facility])       Past Surgical History:  Procedure Laterality Date  . BACK SURGERY        Social History:      Social History   Tobacco Use  . Smoking status: Current Every Day Smoker    Packs/day: 1.00    Types: Cigarettes  . Smokeless tobacco: Never Used  Substance Use Topics  . Alcohol use: No       Family History :     Family History  Problem Relation Age of Onset  . Stroke Brother   . Cancer Father       Home Medications:    Prior to Admission medications   Medication Sig Start Date End Date Taking? Authorizing Provider  ALPRAZolam Duanne Moron) 1 MG tablet Take 1 mg by mouth every 6 (six) hours. Patient takes 1 tablet in the morning and 2 tablets at bedtime    Yes [provider]  citalopram (CELEXA) 40 MG tablet Take 40 mg by mouth at bedtime. 09/01/15  Yes [provider]  zolpidem (AMBIEN) 10 MG tablet Take 10 mg by mouth at bedtime as needed for sleep.   Yes [provider]  aspirin EC 81 MG tablet Take 81 mg by mouth daily.    [provider]  Cyanocobalamin (VITAMIN B-12 PO) Take 400 mcg by mouth daily.    [provider]  cyclobenzaprine (FLEXERIL) 10 MG tablet Take 10 mg by mouth 3 (three) times daily as needed for muscle spasms.  [provider]  fentaNYL (DURAGESIC - DOSED MCG/HR) 100 MCG/HR Place 1 patch onto the skin every 3 (three) days. 09/03/15   [provider]  halobetasol (ULTRAVATE) 0.05 % ointment Apply 1 application topically 2 (two) times daily.    [provider]  ibuprofen (ADVIL,MOTRIN) 800 MG tablet Take 800 mg by mouth daily as needed for pain.     [provider]  meclizine (ANTIVERT) 25 MG tablet Take 1 tablet (25 mg total) by mouth 3 (three) times daily as needed for dizziness. 12/26/18   Caccavale, Sophia, PA-C  PRESCRIPTION MEDICATION Apply 1 application topically daily as needed (breakout). salacid 50% petroleum jelly    [provider]  topiramate (TOPAMAX) 25 MG tablet Take one tablet at night for one week, then take 2 tablets at night for one week, then take 3 tablets at night. Patient taking differently: Take 25 mg by mouth daily. Take one tablet at night for one week, then take 2 tablets at night for one week, then take 3 tablets at night. 05/10/16   Kathrynn Ducking, MD     Allergies:    No Known Allergies   Physical Exam:   Vitals  Blood pressure 133/81, pulse 80, temperature (!) 100.8  F (38.2 C), temperature source Oral, resp. rate (!) 22, height _0  (1.88 m), SpO2 100 %.  1.  General: Appears in no acute distress  2. Psychiatric: Alert, unable to determine orientation, patient not answering questions, though he knows he is in the hospital  3. Neurologic: Cranial nerves II through XII grossly intact, moving all extremities  4. HEENMT:  Atraumatic normocephalic, extraocular muscles are intact  5. Respiratory : Clear to auscultation bilaterally, no wheezing or crackles  6. Cardiovascular : S1-S2, regular, no murmur auscultated, no edema in the lower extremities  7. Gastrointestinal:  Abdomen is soft, nontender, no organomegaly     Data Review:    CBC Recent Labs  Lab 02/09/19 1943  WBC 21.0*  HGB 17.2*  HCT 50.8  PLT 254  MCV 95.0  MCH 32.1  MCHC 33.9  RDW 13.8  LYMPHSABS 1.3  MONOABS 1.0  EOSABS 0.0  BASOSABS 0.0   ------------------------------------------------------------------------------------------------------------------  Results for orders placed or performed during the hospital encounter of 02/09/19 (from the past 48 hour(s))  Comprehensive metabolic panel     Status: Abnormal   Collection Time: 02/09/19  7:43 PM  Result Value Ref Range   Sodium 137 135 - 145 mmol/L   Potassium 2.8 (L) 3.5 - 5.1 mmol/L   Chloride 102 98 - 111 mmol/L   CO2 25 22 - 32 mmol/L   Glucose, Bld 132 (H) 70 - 99 mg/dL   BUN 23 (H) 6 - 20 mg/dL   Creatinine, Ser 1.02 0.61 - 1.24 mg/dL   Calcium 8.8 (L) 8.9 - 10.3 mg/dL   Total Protein 8.2 (H) 6.5 - 8.1 g/dL   Albumin 3.8 3.5 - 5.0 g/dL   AST 81 (H) 15 - 41 U/L   ALT 147 (H) 0 - 44 U/L   Alkaline Phosphatase 178 (H) 38 - 126 U/L   Total Bilirubin 1.9 (H) 0.3 - 1.2 mg/dL   GFR calc non Af Amer >60 >60 mL/min   GFR calc Af Amer >60 >60 mL/min   Anion gap 10 5 - 15    Comment: Performed at [facility], [address], [place], [place] [address]  Troponin I (High Sensitivity)     Status: Abnormal  Collection Time: 02/09/19  7:43 PM  Result Value Ref Range   Troponin I (High Sensitivity) 44 (H) <18 ng/L    Comment: (NOTE) Elevated high sensitivity troponin I (hsTnI) values and significant  changes across serial measurements may suggest ACS but many other  chronic and acute conditions are known to elevate hsTnI results.  Refer to the "Links" section for chest pain algorithms and additional  guidance. Performed at [facility], [address] [place], [place] [address]   CBC with Differential     Status: Abnormal   Collection Time: 02/09/19  7:43 PM  Result Value Ref Range   WBC 21.0 (H) 4.0 - 10.5 K/uL   RBC 5.35 4.22 - 5.81 MIL/uL   Hemoglobin 17.2 (H) 13.0 - 17.0 g/dL   HCT 50.8 39.0 - 52.0 %   MCV 95.0 80.0 - 100.0 fL   MCH 32.1 26.0 - 34.0 pg   MCHC 33.9 30.0 - 36.0 g/dL   RDW 13.8 11.5 - 15.5 %   Platelets 254 150 - 400 K/uL   nRBC 0.0 0.0 - 0.2 %   Neutrophils Relative % 88 %   Neutro Abs 18.5 (H) 1.7 - 7.7 K/uL   Lymphocytes Relative 6 %   Lymphs Abs 1.3 0.7 - 4.0 K/uL   Monocytes Relative 5 %   Monocytes Absolute 1.0 0.1 - 1.0 K/uL   Eosinophils Relative 0 %   Eosinophils Absolute 0.0 0.0 - 0.5 K/uL   Basophils Relative 0 %   Basophils Absolute 0.0 0.0 - 0.1 K/uL   Immature Granulocytes 1 %   Abs Immature Granulocytes 0.15 (H) 0.00 - 0.07 K/uL    Comment: Performed at [facility], [address] [place], [place] [address]  Ethanol     Status: None   Collection Time: 02/09/19  7:44 PM  Result Value Ref Range   Alcohol, Ethyl (B) <10 <10 mg/dL    Comment: (NOTE) Lowest detectable limit for serum alcohol is 10 mg/dL. For medical purposes only. Performed at [facility], [address], [place], [place] [address]   Lactic acid, plasma     Status: Abnormal   Collection Time: 02/09/19  7:44 PM  Result Value Ref Range   Lactic Acid, Venous 2.9 (HH) 0.5 - 1.9 mmol/L    Comment: CRITICAL RESULT CALLED TO, READ BACK BY AND VERIFIED WITH: MYRICK,B  ON 02/09/19 AT  2030 BY LOY,C Performed at [facility], [address] [place], [place] [address]   Ammonia     Status: None   Collection Time: 02/09/19  7:44 PM  Result Value Ref Range   Ammonia 16 9 - 35 umol/L    Comment: Performed at [facility], [address] [place], [place] [address]  CK     Status: None   Collection Time: 02/09/19  7:44 PM  Result Value Ref Range   Total CK 209 49 - 397 U/L    Comment: Performed at [facility], [address], [place], [place] [address]  POC SARS Coronavirus 2 Ag-ED - Nasal Swab (BD Veritor Kit)     Status: None   Collection Time: 02/09/19  8:31 PM  Result Value Ref Range   SARS Coronavirus 2 Ag NEGATIVE NEGATIVE    Comment: (NOTE) SARS-CoV-2 antigen NOT DETECTED.  Negative results are presumptive.  Negative results do not preclude SARS-CoV-2 infection and should not be used as the sole basis for treatment or other patient management decisions, including infection  control decisions, particularly in the presence  of clinical signs and  symptoms consistent with COVID-19, or in those who have been in contact with the virus.  Negative results must be combined with clinical observations, patient history, and epidemiological information. The expected result is Negative. Fact Sheet for Patients: PodPark.tn Fact Sheet for Healthcare Providers: GiftContent.is This test is not yet approved or cleared by the [place] FDA and  has been authorized for detection and/or diagnosis of SARS-CoV-2 by FDA under an Emergency Use Authorization (EUA).  This EUA will remain in effect (meaning this test can be used) for the duration of  the COVID-19 de claration under Section 564(b)(1) of the Act, 21 U.S.C. section 360bbb-3(b)(1), unless the authorization is terminated or revoked sooner.   Lactic acid, plasma     Status: Abnormal   Collection Time: 02/09/19  9:23 PM  Result Value Ref Range   Lactic Acid,  Venous 2.0 (HH) 0.5 - 1.9 mmol/L    Comment: CRITICAL RESULT CALLED TO, READ BACK BY AND VERIFIED WITH: WALTIGTON,K AT 2203 ON 12.14.20 BY ISLEY,B Performed at [facility], [address] [place], [place] [address]   Troponin I (High Sensitivity)     Status: Abnormal   Collection Time: 02/09/19  9:23 PM  Result Value Ref Range   Troponin I (High Sensitivity) 46 (H) <18 ng/L    Comment: (NOTE) Elevated high sensitivity troponin I (hsTnI) values and significant  changes across serial measurements may suggest ACS but many other  chronic and acute conditions are known to elevate hsTnI results.  Refer to the "Links" section for chest pain algorithms and additional  guidance. Performed at [facility], [address], [place], [place] [address]     Chemistries  Recent Labs  Lab 02/09/19 1943  NA 137  K 2.8*  CL 102  CO2 25  GLUCOSE 132*  BUN 23*  CREATININE 1.02  CALCIUM 8.8*  AST 81*  ALT 147*  ALKPHOS 178*  BILITOT 1.9*   ------------------------------------------------------------------------------------------------------------------  ------------------------------------------------------------------------------------------------------------------ GFR: CrCl cannot be calculated (Unknown ideal weight.). Liver Function Tests: Recent Labs  Lab 02/09/19 1943  AST 81*  ALT 147*  ALKPHOS 178*  BILITOT 1.9*  PROT 8.2*  ALBUMIN 3.8   No results for input(s): LIPASE, AMYLASE in the last 168 hours. Recent Labs  Lab 02/09/19 1944  AMMONIA 16   Coagulation Profile: No results for input(s): INR, PROTIME in the last 168 hours. Cardiac Enzymes: Recent Labs  Lab 02/09/19 1944  CKTOTAL 209   BNP (last 3 results) No results for input(s): PROBNP in the last 8760 hours. HbA1C: No results for input(s): HGBA1C in the last 72 hours. CBG: No results for input(s): GLUCAP in the last 168 hours. Lipid Profile: No results for input(s): CHOL, HDL, LDLCALC, TRIG, CHOLHDL,  LDLDIRECT in the last 72 hours. Thyroid Function Tests: No results for input(s): TSH, T4TOTAL, FREET4, T3FREE, THYROIDAB in the last 72 hours. Anemia Panel: No results for input(s): VITAMINB12, FOLATE, FERRITIN, TIBC, IRON, RETICCTPCT in the last 72 hours.  --------------------------------------------------------------------------------------------------------------- Urine analysis:    Component Value Date/Time   COLORURINE AMBER (A) 12/26/2018 1026   APPEARANCEUR CLEAR 12/26/2018 1026   LABSPEC 1.013 12/26/2018 1026   PHURINE 6.0 12/26/2018 1026   GLUCOSEU NEGATIVE 12/26/2018 1026   HGBUR NEGATIVE 12/26/2018 1026   BILIRUBINUR NEGATIVE 12/26/2018 1026   KETONESUR NEGATIVE 12/26/2018 1026   PROTEINUR NEGATIVE 12/26/2018 1026   UROBILINOGEN 0.2 11/06/2012 2025   NITRITE NEGATIVE 12/26/2018 [place] 12/26/2018 1026  Imaging Results:    CT Head Wo Contrast  Result Date: 02/09/2019 CLINICAL DATA:  Found down EXAM: CT HEAD WITHOUT CONTRAST TECHNIQUE: Contiguous axial images were obtained from the base of the skull through the vertex without intravenous contrast. COMPARISON:  CT 12/26/2018 FINDINGS: Brain: Stable region of gliosis in the left centrum semiovale (2/19). No evidence of acute infarction, hemorrhage, hydrocephalus, extra-axial collection or mass lesion/mass effect. Symmetric prominence of the ventricles, cisterns and sulci compatible with frontal lobe predominant parenchymal volume loss. Patchy areas of white matter hypoattenuation are most compatible with chronic microvascular angiopathy. Vascular: No hyperdense vessel or unexpected calcification. Skull: No calvarial fracture or suspicious osseous lesion. No scalp swelling or hematoma. Sinuses/Orbits: Few pneumatized secretions in the left maxillary sinus. Remaining paranasal sinuses are predominantly clear. Hypopneumatization of the left mastoid air cells. Included orbital structures are unremarkable.  Other: None IMPRESSION: 1. No acute intracranial abnormality. 2. Stable region of gliosis in the left centrum semiovale. 3. Stable frontal lobe predominant parenchymal volume loss and chronic microvascular angiopathy. 4. Pneumatized secretions in the left maxillary sinus, could correlate for features of acute sinusitis. Electronically Signed   By: Lovena Le M.D.   On: 02/09/2019 22:04   DG Chest Port 1 View  Result Date: 02/09/2019 CLINICAL DATA:  Weakness, found down in living quarters EXAM: PORTABLE CHEST 1 VIEW COMPARISON:  Radiograph 11/09/2018, CT October 11, 2004 FINDINGS: Mildly increased interstitial opacity without focal consolidation. No pneumothorax or effusion. The aorta is calcified. The remaining cardiomediastinal contours are unremarkable. Degenerative changes are present in the imaged spine and shoulders. Stable remote posttraumatic deformity of the distal right clavicle. IMPRESSION: 1. Mildly increased interstitial opacity without focal consolidation. Possibly edematous or inflammatory. 2. Stable remote posttraumatic deformity of the distal right clavicle. Electronically Signed   By: Lovena Le M.D.   On: 02/09/2019 20:31    My personal review of EKG: Rhythm NSR, no ST changes.   Assessment & Plan:    Active Problems:   Fall   1. Fall-unclear etiology whether patient had syncopal episode versus seizure.  Urine drug screen is currently pending.  Patient is a poor historian and choosing not to talk at this time.  Cardiac enzymes x2 have been negative.  Will obtain EEG in a.m.  CK mildly elevated to 209, ammonia level 16, chest x-ray is clear.  Will closely monitor patient on telemetry.  I do not see any usefulness to order echo at this time.  Previous echo from 2018 showed EF of 55 to 60% with grade 1 diastolic dysfunction, mild to moderate LVH.  Also patient is on Flexeril, fentanyl, Xanax, he could have  overdosed on these medications.  Will hold all these medications at this  time.  2. History of headaches-continue Topamax  3. History of dizziness-continue meclizine 25 mg 3 times daily as needed  4. Anxiety/depression-continue Celexa 40 mg daily at bedtime    DVT Prophylaxis-   Lovenox   AM Labs Ordered, also please review Full Orders  Family Communication: Admission, patients condition and plan of care including tests being ordered have been discussed with the patient  who indicate understanding and agree with the plan and Code Status.  Code Status: Full code  Admission status:Inpatient: Based on patients clinical presentation and evaluation of above clinical data, I have made determination that patient meets Inpatient criteria at this time.  Time spent in minutes : 60 minutes   Yemaya Barnier S Bronislaus Verdell M.D

## 2019-02-09 NOTE — ED Provider Notes (Signed)
[facility] EMERGENCY DEPARTMENT Provider Note   CSN: [id number] Arrival date & time: 02/09/19  1843     History Chief Complaint  Patient presents with  . Weakness    Tim Schwartz is a 53 y.o. male.  Patient is a 53 year old male with history of prior CVA, HTN, and prior chronic opioid use.  Patient brought here by EMS after family found him to be on the ground and not responding.  Patient apparently lives in a small shed behind his mother's house.  When they went to check on him, he was found laying on the ground and having defecated on himself.  EMS was called and the patient was transported here.  He has little additional useful history.  He denies to me he is experiencing any discomfort.  He is uncertain or unable to tell me how he ended up on the floor.  The history is provided by the patient.  Weakness Severity:  Moderate Onset quality:  Sudden Timing:  Constant Relieved by:  Nothing Worsened by:  Nothing      Past Medical History:  Diagnosis Date  . Chronic back pain   . Falls   . Gait abnormality 05/10/2016  . High cholesterol   . Hypertension   . Stroke [facility])     Patient Active Problem List   Diagnosis Date Noted  . Paresthesia 05/10/2016  . Gait abnormality 05/10/2016  . Opioid use with withdrawal (HCC) 10/31/2015  . Altered mental status 10/29/2015  . Hypokalemia 10/29/2015  . Anxiety state 10/29/2015  . Hypertension   . Chronic back pain   . High cholesterol     Past Surgical History:  Procedure Laterality Date  . BACK SURGERY         Family History  Problem Relation Age of Onset  . Stroke Brother   . Cancer Father     Social History   Tobacco Use  . Smoking status: Current Every Day Smoker    Packs/day: 1.00    Types: Cigarettes  . Smokeless tobacco: Never Used  Substance Use Topics  . Alcohol use: No  . Drug use: Not Currently    Types: Cocaine    Comment: former drug use    Home Medications Prior to Admission  medications   Medication Sig Start Date End Date Taking? Authorizing Provider  ALPRAZolam Prudy Feeler) 1 MG tablet Take 1 mg by mouth every 6 (six) hours. Patient takes 1 tablet in the morning and 2 tablets at bedtime     [provider]  aspirin EC 81 MG tablet Take 81 mg by mouth daily.    [provider]  citalopram (CELEXA) 40 MG tablet Take 40 mg by mouth at bedtime. 09/01/15   [provider]  Cyanocobalamin (VITAMIN B-12 PO) Take 400 mcg by mouth daily.    [provider]  cyclobenzaprine (FLEXERIL) 10 MG tablet Take 10 mg by mouth 3 (three) times daily as needed for muscle spasms.    [provider]  fentaNYL (DURAGESIC - DOSED MCG/HR) 100 MCG/HR Place 1 patch onto the skin every 3 (three) days. 09/03/15   [provider]  halobetasol (ULTRAVATE) 0.05 % ointment Apply 1 application topically 2 (two) times daily.    [provider]  ibuprofen (ADVIL,MOTRIN) 800 MG tablet Take 800 mg by mouth daily as needed for pain.     [provider]  meclizine (ANTIVERT) 25 MG tablet Take 1 tablet (25 mg total) by mouth 3 (three) times daily as  needed for dizziness. 12/26/18   Caccavale, Sophia, PA-C  PRESCRIPTION MEDICATION Apply 1 application topically daily as needed (breakout). salacid 50% petroleum jelly    [provider]  topiramate (TOPAMAX) 25 MG tablet Take one tablet at night for one week, then take 2 tablets at night for one week, then take 3 tablets at night. Patient taking differently: Take 25 mg by mouth daily. Take one tablet at night for one week, then take 2 tablets at night for one week, then take 3 tablets at night. 05/10/16   York Spaniel, MD  zolpidem (AMBIEN) 10 MG tablet Take 10 mg by mouth at bedtime as needed for sleep.    [provider]    Allergies    Patient has no known allergies.  Review of Systems   Review of Systems  Neurological: Positive for weakness.  All other systems reviewed  and are negative.   Physical Exam Updated Vital Signs BP 125/81 (BP Location: Left Arm)   Pulse 81   Temp (!) 100.8 F (38.2 C) (Oral)   Resp 15   Ht 6\' 2"  (1.88 m)   SpO2 100%   BMI 27.37 kg/m   Physical Exam Vitals and nursing note reviewed.  Constitutional:      General: He is not in acute distress.    Appearance: He is well-developed. He is not diaphoretic.     Comments: Patient is somewhat pale-appearing.  He is awake and alert.  He responds to commands and is oriented to time, place, and situation.  HENT:     Head: Normocephalic and atraumatic.  Cardiovascular:     Rate and Rhythm: Normal rate and regular rhythm.     Heart sounds: No murmur. No friction rub.  Pulmonary:     Effort: Pulmonary effort is normal. No respiratory distress.     Breath sounds: Normal breath sounds. No wheezing or rales.  Abdominal:     General: Bowel sounds are normal. There is no distension.     Palpations: Abdomen is soft.     Tenderness: There is no abdominal tenderness.  Musculoskeletal:        General: Normal range of motion.     Cervical back: Normal range of motion and neck supple.  Skin:    General: Skin is warm and dry.  Neurological:     General: No focal deficit present.     Mental Status: He is alert and oriented to person, place, and time.     Cranial Nerves: No cranial nerve deficit.     Coordination: Coordination normal.     ED Results / Procedures / Treatments   Labs (all labs ordered are listed, but only abnormal results are displayed) Labs Reviewed  COMPREHENSIVE METABOLIC PANEL  ETHANOL  LACTIC ACID, PLASMA  LACTIC ACID, PLASMA  CBC WITH DIFFERENTIAL/PLATELET  URINALYSIS, ROUTINE W REFLEX MICROSCOPIC  RAPID URINE DRUG SCREEN, HOSP PERFORMED  POC SARS CORONAVIRUS 2 AG -  ED  TROPONIN I (HIGH SENSITIVITY)    EKG None  Radiology No results found.  Procedures Procedures (including critical care time)  Medications Ordered in ED Medications  sodium  chloride 0.9 % bolus 1,000 mL (has no administration in time range)    ED Course  I have reviewed the triage vital signs and the nursing notes.  Pertinent labs & imaging results that were available during my care of the patient were reviewed by me and considered in my medical decision making (see chart for details).  Patient is  a 53 year old male with history of prior CVA, hypertension, and prior chronic use of opioids.  He presents today for evaluation of weakness.  Patient was found on the floor by his mother and his home, then transported here.  Patient somewhat somnolent, but arousable.  He is denying any specific complaints and is unsure of what transpired that resulted in him ending up on the floor.  Patient initially febrile with a temperature of 100.8 and laboratory studies showing a white count of 21,000.  I am uncertain as to the etiology of this fever/white count, but he appears hemodynamically stable and in no distress.  Rapid Covid test is negative, chest xray shows no evidence for pneumonia, head CT is negative.  Blood cultures will be obtained.  Urinalysis pending.  Patient observed for several hours in the ER.  He continues to be somewhat somnolent, but arousable.  He has no specific complaints still.  He has full range of motion of the neck and I doubt meningitis.  At this point, I feel as though patient will require additional observation.  I discussed the case with Dr. Darrick Meigs who agrees to admit.  Tim Schwartz was evaluated in Emergency Department on 02/09/2019 for the symptoms described in the history of present illness. He was evaluated in the context of the global COVID-19 pandemic, which necessitated consideration that the patient might be at risk for infection with the SARS-CoV-2 virus that causes COVID-19. Institutional protocols and algorithms that pertain to the evaluation of patients at risk for COVID-19 are in a state of rapid change based on information released by  regulatory bodies including the CDC and federal and state organizations. These policies and algorithms were followed during the patient's care in the ED.  CRITICAL CARE Performed by: Veryl Speak Total critical care time: 45 minutes Critical care time was exclusive of separately billable procedures and treating other patients. Critical care was necessary to treat or prevent imminent or life-threatening deterioration. Critical care was time spent personally by me on the following activities: development of treatment plan with patient and/or surrogate as well as nursing, discussions with consultants, evaluation of patient's response to treatment, examination of patient, obtaining history from patient or surrogate, ordering and performing treatments and interventions, ordering and review of laboratory studies, ordering and review of radiographic studies, pulse oximetry and re-evaluation of patient's condition.   MDM Rules/Calculators/A&P  Final Clinical Impression(s) / ED Diagnoses Final diagnoses:  None    Rx / DC Orders ED Discharge Orders    None       Veryl Speak, MD 02/09/19 2236

## 2019-02-09 NOTE — ED Triage Notes (Addendum)
Per [facility] report pt found down by family in his living quarters  (shed behind his mother's home)  Pt was aroused and found to have defecated on himself.  EMS reports VSS with pt confused as to what happened and weak upon their arrival.  Pt alert and oriented to self and situation, unsure of date. EMS adds pt mother is COVID positive resulted today.  Pt mother states pt has multiple episodes like this in the past.

## 2019-02-09 NOTE — ED Notes (Signed)
CRITICAL VALUE ALERT  Critical Value: Lactic Acid 2.9  Date & Time Notied: 02/09/19 2028  Provider Notified: Dr. Stark Jock  Orders Received/Actions taken: n/a

## 2019-02-09 NOTE — ED Notes (Signed)
CRITICAL VALUE ALERT  Critical Value: Lactic Acid 2.0  Date & Time Notied: 02/09/19 2204  Provider Notified: Dr. Stark Jock  Orders Received/Actions taken: n/a

## 2019-02-10 ENCOUNTER — Inpatient Hospital Stay (HOSPITAL_COMMUNITY)
Admit: 2019-02-10 | Discharge: 2019-02-10 | Disposition: A | Discharge status: Home / Self Care | Payer: Medicaid Other | Attending: Family Medicine | Admitting: Family Medicine

## 2019-02-10 ENCOUNTER — Inpatient Hospital Stay (HOSPITAL_COMMUNITY): Payer: Medicaid Other

## 2019-02-10 DIAGNOSIS — R55 Syncope and collapse: Secondary | ICD-10-CM | POA: Diagnosis present

## 2019-02-10 DIAGNOSIS — E876 Hypokalemia: Secondary | ICD-10-CM

## 2019-02-10 DIAGNOSIS — I5032 Chronic diastolic (congestive) heart failure: Secondary | ICD-10-CM

## 2019-02-10 DIAGNOSIS — K219 Gastro-esophageal reflux disease without esophagitis: Secondary | ICD-10-CM

## 2019-02-10 LAB — BASIC METABOLIC PANEL
Anion gap: 9 (ref 5–15)
BUN: 21 mg/dL — ABNORMAL HIGH (ref 6–20)
CO2: 23 mmol/L (ref 22–32)
Calcium: 8.3 mg/dL — ABNORMAL LOW (ref 8.9–10.3)
Chloride: 105 mmol/L (ref 98–111)
Creatinine, Ser: 0.82 mg/dL (ref 0.61–1.24)
GFR calc Af Amer: >60 mL/min (ref >60)
GFR calc non Af Amer: >60 mL/min (ref >60)
Glucose, Bld: 86 mg/dL (ref 70–99)
Potassium: 3.1 mmol/L — ABNORMAL LOW (ref 3.5–5.1)
Sodium: 137 mmol/L (ref 135–145)

## 2019-02-10 LAB — COMPREHENSIVE METABOLIC PANEL
ALT: 116 U/L — ABNORMAL HIGH (ref 0–44)
AST: 71 U/L — ABNORMAL HIGH (ref 15–41)
Albumin: 3.2 g/dL — ABNORMAL LOW (ref 3.5–5.0)
Alkaline Phosphatase: 150 U/L — ABNORMAL HIGH (ref 38–126)
Anion gap: 10 (ref 5–15)
BUN: 21 mg/dL — ABNORMAL HIGH (ref 6–20)
CO2: 25 mmol/L (ref 22–32)
Calcium: 8.4 mg/dL — ABNORMAL LOW (ref 8.9–10.3)
Chloride: 104 mmol/L (ref 98–111)
Creatinine, Ser: 0.93 mg/dL (ref 0.61–1.24)
GFR calc Af Amer: >60 mL/min (ref >60)
GFR calc non Af Amer: >60 mL/min (ref >60)
Glucose, Bld: 94 mg/dL (ref 70–99)
Potassium: 2.6 mmol/L — CL (ref 3.5–5.1)
Sodium: 139 mmol/L (ref 135–145)
Total Bilirubin: 1.9 mg/dL — ABNORMAL HIGH (ref 0.3–1.2)
Total Protein: 6.8 g/dL (ref 6.5–8.1)

## 2019-02-10 LAB — URINALYSIS, ROUTINE W REFLEX MICROSCOPIC
Bacteria, UA: NONE SEEN
Bilirubin Urine: NEGATIVE
Glucose, UA: NEGATIVE mg/dL
Ketones, ur: NEGATIVE mg/dL
Leukocytes,Ua: NEGATIVE
Nitrite: NEGATIVE
Protein, ur: 30 mg/dL — AB
Specific Gravity, Urine: 1.02 (ref 1.005–1.030)
pH: 5 (ref 5.0–8.0)

## 2019-02-10 LAB — CBC
HCT: 45.1 % (ref 39.0–52.0)
Hemoglobin: 15.2 g/dL (ref 13.0–17.0)
MCH: 32.4 pg (ref 26.0–34.0)
MCHC: 33.7 g/dL (ref 30.0–36.0)
MCV: 96.2 fL (ref 80.0–100.0)
Platelets: 203 10*3/uL (ref 150–400)
RBC: 4.69 MIL/uL (ref 4.22–5.81)
RDW: 13.8 % (ref 11.5–15.5)
WBC: 14.8 10*3/uL — ABNORMAL HIGH (ref 4.0–10.5)
nRBC: 0 % (ref 0.0–0.2)

## 2019-02-10 LAB — RAPID URINE DRUG SCREEN, HOSP PERFORMED
Amphetamines: NOT DETECTED
Barbiturates: NOT DETECTED
Benzodiazepines: POSITIVE — AB
Cocaine: NOT DETECTED
Opiates: NOT DETECTED
Tetrahydrocannabinol: NOT DETECTED

## 2019-02-10 LAB — HIV ANTIBODY (ROUTINE TESTING W REFLEX): HIV Screen 4th Generation wRfx: NONREACTIVE

## 2019-02-10 LAB — RESPIRATORY PANEL BY RT PCR (FLU A&B, COVID)
Influenza A by PCR: NEGATIVE
Influenza B by PCR: NEGATIVE
SARS Coronavirus 2 by RT PCR: NEGATIVE

## 2019-02-10 LAB — VITAMIN B12: Vitamin B-12: 331 pg/mL (ref 180–914)

## 2019-02-10 LAB — TSH: TSH: 0.864 u[IU]/mL (ref 0.350–4.500)

## 2019-02-10 LAB — MAGNESIUM: Magnesium: 2.1 mg/dL (ref 1.7–2.4)

## 2019-02-10 MED ORDER — LACTATED RINGERS IV SOLN: INTRAVENOUS | Status: DC

## 2019-02-10 MED ORDER — THIAMINE HCL 100 MG PO TABS
100.0000 mg | ORAL_TABLET | Freq: Every day | ORAL | Status: DC
  Administered 2019-02-10 – 2019-02-11 (×2): 100 mg via ORAL
  Filled 2019-02-10 (×2): qty 1

## 2019-02-10 MED ORDER — POTASSIUM CHLORIDE 10 MEQ/100ML IV SOLN
10.0000 meq | Freq: Once | INTRAVENOUS | Status: AC
  Administered 2019-02-10: 08:00:00 10 meq via INTRAVENOUS
  Filled 2019-02-10: qty 100

## 2019-02-10 MED ORDER — MECLIZINE HCL 12.5 MG PO TABS: 25.0000 mg | ORAL_TABLET | Freq: Three times a day (TID) | ORAL | Status: DC | PRN

## 2019-02-10 MED ORDER — POTASSIUM CHLORIDE 10 MEQ/100ML IV SOLN
10.0000 meq | INTRAVENOUS | Status: DC
  Administered 2019-02-10 (×2): 10 meq via INTRAVENOUS
  Filled 2019-02-10 (×2): qty 100

## 2019-02-10 MED ORDER — ENOXAPARIN SODIUM 40 MG/0.4ML ~~LOC~~ SOLN
40.0000 mg | SUBCUTANEOUS | Status: DC
  Administered 2019-02-10 – 2019-02-11 (×2): 40 mg via SUBCUTANEOUS
  Filled 2019-02-10 (×2): qty 0.4

## 2019-02-10 MED ORDER — TOPIRAMATE 25 MG PO TABS
25.0000 mg | ORAL_TABLET | Freq: Three times a day (TID) | ORAL | Status: DC
  Administered 2019-02-10 – 2019-02-11 (×3): 25 mg via ORAL
  Filled 2019-02-10 (×3): qty 1

## 2019-02-10 MED ORDER — TOPIRAMATE 25 MG PO TABS
25.0000 mg | ORAL_TABLET | Freq: Every day | ORAL | Status: DC
  Administered 2019-02-10: 12:00:00 25 mg via ORAL
  Filled 2019-02-10: qty 1

## 2019-02-10 MED ORDER — ONDANSETRON HCL 4 MG PO TABS: 4.0000 mg | ORAL_TABLET | Freq: Four times a day (QID) | ORAL | Status: DC | PRN

## 2019-02-10 MED ORDER — ONDANSETRON HCL 4 MG/2ML IJ SOLN: 4.0000 mg | Freq: Four times a day (QID) | INTRAMUSCULAR | Status: DC | PRN

## 2019-02-10 MED ORDER — ACETAMINOPHEN 650 MG RE SUPP: 650.0000 mg | Freq: Four times a day (QID) | RECTAL | Status: DC | PRN

## 2019-02-10 MED ORDER — CITALOPRAM HYDROBROMIDE 20 MG PO TABS
40.0000 mg | ORAL_TABLET | Freq: Every day | ORAL | Status: DC
  Administered 2019-02-10: 40 mg via ORAL
  Filled 2019-02-10 (×2): qty 2

## 2019-02-10 MED ORDER — POTASSIUM CHLORIDE CRYS ER 20 MEQ PO TBCR
40.0000 meq | EXTENDED_RELEASE_TABLET | ORAL | Status: AC
  Administered 2019-02-10 (×3): 40 meq via ORAL
  Filled 2019-02-10 (×3): qty 2

## 2019-02-10 MED ORDER — ACETAMINOPHEN 325 MG PO TABS: 650.0000 mg | ORAL_TABLET | Freq: Four times a day (QID) | ORAL | Status: DC | PRN

## 2019-02-10 MED ORDER — FOLIC ACID 1 MG PO TABS
1.0000 mg | ORAL_TABLET | Freq: Every day | ORAL | Status: DC
  Administered 2019-02-10 – 2019-02-11 (×2): 1 mg via ORAL
  Filled 2019-02-10 (×2): qty 1

## 2019-02-10 NOTE — ED Notes (Addendum)
Ultrasound and hospitalist at bedside at this time.

## 2019-02-10 NOTE — Progress Notes (Signed)
EEG completed, results pending. 

## 2019-02-10 NOTE — ED Notes (Signed)
PT at bedside at this time doing eval.

## 2019-02-10 NOTE — Procedures (Signed)
Patient Name: Tim Schwartz  MRN: [id number]  Epilepsy Attending: Lora Havens  Referring Physician/Provider: Dr. Eleonore Chiquito Date: 02/10/2019 Duration: 24.26 mins  Patient history: 53 year old male who presented with spells concerning for syncope versus seizure.  EEG to evaluate for seizure.  Level of alertness: Awake  AEDs during EEG study: Xanax  Technical aspects: This EEG study was done with scalp electrodes positioned according to the 10-20 International system of electrode placement. Electrical activity was acquired at a sampling rate of 500Hz  and reviewed with a high frequency filter of 70Hz  and a low frequency filter of 1Hz . EEG data were recorded continuously and digitally stored.   Description:The posterior dominant rhythm consists of 9-10 Hz activity of moderate voltage (25-35 uV) seen predominantly in posterior head regions, symmetric and reactive to eye opening and eye closing. There is an excessive amount of 15 to 18 Hz, 2-3 uV beta activity with irregular morphology distributed symmetrically and diffusely. Hyperventilation and photic stimulation were not performed.  Abnormality -Excessive beta, generalized  IMPRESSION: This study is within normal limits. The excessive beta activity seen in the background is most likely due to the effect of benzodiazepine and is a benign EEG pattern.  No seizures or epileptiform discharges were seen throughout the recording.

## 2019-02-10 NOTE — Evaluation (Signed)
Physical Therapy Evaluation Patient Details Name: Tim Schwartz MRN: [id number] DOB: 17-Mar-1965 Today's Date: 02/10/2019   History of Present Illness  Tim Schwartz  is a 53 y.o. male,*with history of chronic back pain, gait abnormality, paresthesia, hypertension, chronic opioid use was brought to hospital by EMS after patient's mother found him on the ground unresponsive.  Patient lives in a small shed behind his mother's house.  When she went to check on him he was found laying on the ground and having defecated on himself.  EMS was called and patient was transported to the ED for further evaluation.  Patient is a poor historian, he is choosing not to answer my questions though he is alert and responding to my questions by head nodding.    Clinical Impression  Patient able to respond to questions nodding head and followed most directions when prompted, but non-verbal throughout visit. Patient slightly lethargic and demonstrates slow labored movement for sitting up at bedside, limited to standing with RW for up to 20 seconds, unable to take steps secondary to weakness and had to sit due to loss of standing balance.  Patient required Max assistance to reposition in bed.  Patient will benefit from continued physical therapy in hospital and recommended venue below to increase strength, balance, endurance for safe ADLs and gait.    Follow Up Recommendations SNF;Supervision - Intermittent;Supervision for mobility/OOB    Equipment Recommendations  Rolling walker with 5" wheels    Recommendations for Other Services       Precautions / Restrictions Precautions Precautions: Fall Restrictions Weight Bearing Restrictions: No      Mobility  Bed Mobility Overal bed mobility: Needs Assistance Bed Mobility: Supine to Sit;Sit to Supine     Supine to sit: Mod assist Sit to supine: Min assist   General bed mobility comments: labored movement  Transfers Overall transfer level: Needs  assistance Equipment used: Rolling walker (2 wheeled) Transfers: Sit to/from Stand Sit to Stand: Mod assist         General transfer comment: very unsteady on feet  Ambulation/Gait                Stairs            Wheelchair Mobility    Modified Rankin (Stroke Patients Only)       Balance Overall balance assessment: Needs assistance Sitting-balance support: Bilateral upper extremity supported;Feet supported Sitting balance-Leahy Scale: Fair Sitting balance - Comments: sitting at EOB   Standing balance support: Bilateral upper extremity supported;During functional activity Standing balance-Leahy Scale: Poor Standing balance comment: using RW                             Pertinent Vitals/Pain Pain Assessment: No/denies pain    Home Living Family/patient expects to be discharged to:: Private residence Living Arrangements: Parent Available Help at Discharge: Family;Available PRN/intermittently Type of Home: House       Home Layout: One level Home Equipment: Cane - single point      Prior Function Level of Independence: Independent         Comments: household ambulator     Journalist, newspaper        Extremity/Trunk Assessment   Upper Extremity Assessment Upper Extremity Assessment: RUE deficits/detail;LUE deficits/detail RUE Deficits / Details: grossly -4/5 LUE Deficits / Details: grossly 3/5    Lower Extremity Assessment Lower Extremity Assessment: Generalized weakness    Cervical / Trunk Assessment Cervical / Trunk Assessment:  Normal  Communication   Communication: Other (comment)(Patient would not speak, but nods head)  Cognition Arousal/Alertness: Awake/alert Behavior During Therapy: Flat affect Overall Cognitive Status: No family/caregiver present to determine baseline cognitive functioning                                        General Comments      Exercises     Assessment/Plan    PT  Assessment Patient needs continued PT services  PT Problem List Decreased strength;Decreased activity tolerance;Decreased balance;Decreased mobility       PT Treatment Interventions DME instruction;Gait training;Stair training;Functional mobility training;Therapeutic activities;Therapeutic exercise;Balance training;Patient/family education    PT Goals (Current goals can be found in the Care Plan section)  Acute Rehab PT Goals Patient Stated Goal: not stated PT Goal Formulation: With patient Time For Goal Achievement: 02/24/19 Potential to Achieve Goals: Good    Frequency Min 2X/week   Barriers to discharge        Co-evaluation               AM-PAC PT "6 Clicks" Mobility  Outcome Measure Help needed turning from your back to your side while in a flat bed without using bedrails?: A Little Help needed moving from lying on your back to sitting on the side of a flat bed without using bedrails?: A Lot Help needed moving to and from a bed to a chair (including a wheelchair)?: A Lot Help needed standing up from a chair using your arms (e.g., wheelchair or bedside chair)?: A Lot Help needed to walk in hospital room?: A Lot Help needed climbing 3-5 steps with a railing? : Total 6 Click Score: 12    End of Session   Activity Tolerance: Patient limited by fatigue Patient left: in bed;with call bell/phone within reach Nurse Communication: Mobility status PT Visit Diagnosis: Unsteadiness on feet (R26.81);Other abnormalities of gait and mobility (R26.89);Muscle weakness (generalized) (M62.81)    Time: 0354-6568 PT Time Calculation (min) (ACUTE ONLY): 25 min   Charges:   PT Evaluation $PT Eval Moderate Complexity: 1 Mod PT Treatments $Therapeutic Activity: 23-37 mins        2:07 PM, 02/10/19 Ocie Bob, MPT Physical Therapist with [facility] [phone] office [phone] mobile phone

## 2019-02-10 NOTE — ED Notes (Signed)
EEG in progress at this time at bedside.

## 2019-02-10 NOTE — ED Notes (Signed)
Pt alert, but unable or unwilling to answer questions. Will shake head no to most questions and shakes head no to RN asking if he can speak. Gleaned some screening questions, but pt non-contributory for most of the assessment.

## 2019-02-10 NOTE — Plan of Care (Signed)
  Problem: Acute Rehab PT Goals(only PT should resolve) Goal: Pt Will Go Supine/Side To Sit Outcome: Progressing Flowsheets (Taken 02/10/2019 1410) Pt will go Supine/Side to Sit: with supervision Goal: Patient Will Transfer Sit To/From Stand Outcome: Progressing Flowsheets (Taken 02/10/2019 1410) Patient will transfer sit to/from stand: with min guard assist Goal: Pt Will Transfer Bed To Chair/Chair To Bed Outcome: Progressing Flowsheets (Taken 02/10/2019 1410) Pt will Transfer Bed to Chair/Chair to Bed: min guard assist Goal: Pt Will Ambulate Outcome: Progressing Flowsheets (Taken 02/10/2019 1410) Pt will Ambulate:  50 feet  with minimal assist  with rolling walker   2:11 PM, 02/10/19 Lonell Grandchild, MPT Physical Therapist with [facility] [phone] office [phone] mobile phone

## 2019-02-10 NOTE — Progress Notes (Signed)
Patient daughter, Joelene Millin requested she be called with any information about patient or if patient is discharged.  Patient daughter states that patient's mother is elderly and very sick herself, and sometimes has difficulty relaying information to family.

## 2019-02-10 NOTE — ED Notes (Signed)
CRITICAL VALUE ALERT  Critical Value: Potassium 2.6  Date & Time Notied: 02/10/19 0617  Provider Notified: Dr. Darrick Meigs via Shea Evans  Orders Received/Actions taken: n/a

## 2019-02-10 NOTE — Progress Notes (Signed)
PROGRESS NOTE    Tim Schwartz  FWY:[id number] DOB: 1966/02/02 DOA: 02/09/2019 PCP: Pearson Grippe, MD     Brief Narrative:   53 y.o. male with history of chronic back pain, gait abnormality, paresthesia, hypertension, chronic opioid use was brought to hospital by EMS after patient's mother found him on the ground unresponsive.  Patient lives in a small shed behind his mother's house.  When she went to check on him he was found laying on the ground and having defecated on himself.  EMS was called and patient was transported to the ED for further evaluation.  Patient is a poor historian, he is choosing not to answer my questions though he is alert and responding to my questions by head nodding. He denies chest pain or shortness of breath. Does not remember whether he passed out or not. Denies abdominal pain or dysuria. Unfortunately no other history is obtainable as patient is not very cooperative at this time.  COVID test neg  Assessment & Plan: 1-fall/Syncope -EEG negative -Cardiac enzymes negative x2 -2D echo pending -physical therapy eval pending  -So far evaluation suggesting nonintentional overuse of medications including fentanyl, Flexeril, and Xanax  2-hypokalemia -Magnesium within normal limits -Continue electrolytes repletion -Monitor on telemetry.  3-history of headaches/chronic dizziness -Continue Topamax -Continue meclizine as needed.  4-anxiety/depression -Continue Celexa -No suicidal ideation or hallucination.  5-chronic diastolic heart failure -Follow 2D echo -Daily weights and strict I's and O's -Heart healthy diet -No shortness of breath or signs of exacerbation currently.  DVT prophylaxis: Lovenox Code Status: Full code Family Communication: No family at bedside. Disposition Plan: remains inpatient, check 2D echo, replete electrolytes and continue hydration.  Follow physical therapy evaluation rec's and continue to follow clinical  response.  Consultants:   None  Procedures:   2D echo: Pending  EEG: No signs of epilepticus and normalities; excessive beta activity seen in the background in the setting of benzodiazepine usage.  Antimicrobials:  Anti-infectives (From admission, onward)   None      Subjective: Patient is currently afebrile, denies chest pain, no nausea, no vomiting.  Flat affect on examination.  No focal deficits.  Objective: Vitals:   02/10/19 1547 02/10/19 1600 02/10/19 1645 02/10/19 1647  BP: 122/77 128/80 117/64   Pulse: 77 70 (!) 39 65  Resp: 18 (!) 22  18  Temp:   99.2 F (37.3 C)   TempSrc:   Oral   SpO2: 97% 99% 100% 100%  Height:        Intake/Output Summary (Last 24 hours) at 02/10/2019 1734 Last data filed at 02/10/2019 1158 Gross per 24 hour  Intake 697.67 ml  Output --  Net 697.67 ml   There were no vitals filed for this visit.  Examination: General exam: Alert, awake, oriented x 3; patient reports no chest pain, no shortness of breath, no nausea, no vomiting.  Reports at this moment no headaches, no focal weakness.  Good oxygen saturation on room air. Respiratory system: Clear to auscultation. Respiratory effort normal. Cardiovascular system: Rate controlled, no rubs, no gallops, no murmurs appreciated on exam. Gastrointestinal system: Abdomen is nondistended, soft and nontender. No organomegaly or masses felt. Normal bowel sounds heard. Central nervous system: Alert and oriented. No focal neurological deficits. Extremities: No cyanosis or clubbing. Skin: No rashes, no petechiae. Psychiatry: Mood & affect appropriate.     Data Reviewed: I have personally reviewed following labs and imaging studies  CBC: Recent Labs  Lab 02/09/19 1943 02/10/19 0514  WBC 21.0*  14.8*  NEUTROABS 18.5*  --   HGB 17.2* 15.2  HCT 50.8 45.1  MCV 95.0 96.2  PLT 254 638   Basic Metabolic Panel: Recent Labs  Lab 02/09/19 1943 02/10/19 0514 02/10/19 1027  NA 137 139 137   K 2.8* 2.6* 3.1*  CL 102 104 105  CO2 25 25 23   GLUCOSE 132* 94 86  BUN 23* 21* 21*  CREATININE 1.02 0.93 0.82  CALCIUM 8.8* 8.4* 8.3*  MG  --  2.1  --    GFR: CrCl cannot be calculated (Unknown ideal weight.).   Liver Function Tests: Recent Labs  Lab 02/09/19 1943 02/10/19 0514  AST 81* 71*  ALT 147* 116*  ALKPHOS 178* 150*  BILITOT 1.9* 1.9*  PROT 8.2* 6.8  ALBUMIN 3.8 3.2*    Recent Labs  Lab 02/09/19 1944  AMMONIA 16   Cardiac Enzymes: Recent Labs  Lab 02/09/19 1944  CKTOTAL 209   Urine analysis:    Component Value Date/Time   COLORURINE YELLOW 02/09/2019 1924   APPEARANCEUR HAZY (A) 02/09/2019 1924   LABSPEC 1.020 02/09/2019 1924   PHURINE 5.0 02/09/2019 1924   GLUCOSEU NEGATIVE 02/09/2019 1924   HGBUR SMALL (A) 02/09/2019 [place] NEGATIVE 02/09/2019 [place] NEGATIVE 02/09/2019 1924   PROTEINUR 30 (A) 02/09/2019 1924   UROBILINOGEN 0.2 11/06/2012 2025   NITRITE NEGATIVE 02/09/2019 1924   LEUKOCYTESUR NEGATIVE 02/09/2019 1924    Recent Results (from the past 240 hour(s))  Blood culture (routine x 2)     Status: None (Preliminary result)   Collection Time: 02/09/19  7:45 PM   Specimen: BLOOD  Result Value Ref Range Status   Specimen Description BLOOD RIGHT ANTECUBITAL  Final   Special Requests   Final    BOTTLES DRAWN AEROBIC AND ANAEROBIC Blood Culture adequate volume   Culture   Final    NO GROWTH < 12 HOURS Performed at [facility], [address], [place], [place] [address]    Report Status PENDING  Incomplete  Blood culture (routine x 2)     Status: None (Preliminary result)   Collection Time: 02/09/19 11:10 PM   Specimen: BLOOD  Result Value Ref Range Status   Specimen Description BLOOD LEFT ANTECUBITAL  Final   Special Requests   Final    BOTTLES DRAWN AEROBIC AND ANAEROBIC Blood Culture adequate volume   Culture   Final    NO GROWTH < 12 HOURS Performed at [facility], [address], [place], [place]  [address]    Report Status PENDING  Incomplete  Respiratory Panel by RT PCR (Flu A&B, Covid) - Nasopharyngeal Swab     Status: None   Collection Time: 02/10/19  5:15 AM   Specimen: Nasopharyngeal Swab  Result Value Ref Range Status   SARS Coronavirus 2 by RT PCR NEGATIVE NEGATIVE Final    Comment: (NOTE) SARS-CoV-2 target nucleic acids are NOT DETECTED. The SARS-CoV-2 RNA is generally detectable in upper respiratoy specimens during the acute phase of infection. The lowest concentration of SARS-CoV-2 viral copies this assay can detect is 131 copies/mL. A negative result does not preclude SARS-Cov-2 infection and should not be used as the sole basis for treatment or other patient management decisions. A negative result may occur with  improper specimen collection/handling, submission of specimen other than nasopharyngeal swab, presence of viral mutation(s) within the areas targeted by this assay, and inadequate number of viral copies (<131 copies/mL). A negative result must be combined with clinical observations,  patient history, and epidemiological information. The expected result is Negative. Fact Sheet for Patients:  https://www.moore.com/https://www.fda.gov/media/142436/download Fact Sheet for Healthcare Providers:  https://www.young.biz/https://www.fda.gov/media/142435/download This test is not yet ap proved or cleared by the [place] FDA and  has been authorized for detection and/or diagnosis of SARS-CoV-2 by FDA under an Emergency Use Authorization (EUA). This EUA will remain  in effect (meaning this test can be used) for the duration of the COVID-19 declaration under Section 564(b)(1) of the Act, 21 U.S.C. section 360bbb-3(b)(1), unless the authorization is terminated or revoked sooner.    Influenza A by PCR NEGATIVE NEGATIVE Final   Influenza B by PCR NEGATIVE NEGATIVE Final    Comment: (NOTE) The Xpert Xpress SARS-CoV-2/FLU/RSV assay is intended as an aid in  the diagnosis of influenza from Nasopharyngeal swab  specimens and  should not be used as a sole basis for treatment. Nasal washings and  aspirates are unacceptable for Xpert Xpress SARS-CoV-2/FLU/RSV  testing. Fact Sheet for Patients: https://www.moore.com/https://www.fda.gov/media/142436/download Fact Sheet for Healthcare Providers: https://www.young.biz/https://www.fda.gov/media/142435/download This test is not yet approved or cleared by the [place] FDA and  has been authorized for detection and/or diagnosis of SARS-CoV-2 by  FDA under an Emergency Use Authorization (EUA). This EUA will remain  in effect (meaning this test can be used) for the duration of the  Covid-19 declaration under Section 564(b)(1) of the Act, 21  U.S.C. section 360bbb-3(b)(1), unless the authorization is  terminated or revoked. Performed at [facility], [address] [place], [place] [address]      Radiology Studies: CT Head Wo Contrast  Result Date: 02/09/2019 CLINICAL DATA:  Found down EXAM: CT HEAD WITHOUT CONTRAST TECHNIQUE: Contiguous axial images were obtained from the base of the skull through the vertex without intravenous contrast. COMPARISON:  CT 12/26/2018 FINDINGS: Brain: Stable region of gliosis in the left centrum semiovale (2/19). No evidence of acute infarction, hemorrhage, hydrocephalus, extra-axial collection or mass lesion/mass effect. Symmetric prominence of the ventricles, cisterns and sulci compatible with frontal lobe predominant parenchymal volume loss. Patchy areas of white matter hypoattenuation are most compatible with chronic microvascular angiopathy. Vascular: No hyperdense vessel or unexpected calcification. Skull: No calvarial fracture or suspicious osseous lesion. No scalp swelling or hematoma. Sinuses/Orbits: Few pneumatized secretions in the left maxillary sinus. Remaining paranasal sinuses are predominantly clear. Hypopneumatization of the left mastoid air cells. Included orbital structures are unremarkable. Other: None IMPRESSION: 1. No acute intracranial abnormality.  2. Stable region of gliosis in the left centrum semiovale. 3. Stable frontal lobe predominant parenchymal volume loss and chronic microvascular angiopathy. 4. Pneumatized secretions in the left maxillary sinus, could correlate for features of acute sinusitis. Electronically Signed   By: Kreg ShropshirePrice  DeHay M.D.   On: 02/09/2019 22:04   Abdominal ultrasound  Result Date: 02/10/2019 CLINICAL DATA:  Transaminitis. EXAM: ABDOMEN ULTRASOUND COMPLETE COMPARISON:  CT 11/07/2012. FINDINGS: Gallbladder: Limited evaluation due to overlying bowel gas. No gallstones identified. Gallbladder wall thickness 2.9 mm. Common bile duct: Diameter: 3.5 mm Liver: Limited evaluation due to overlying bowel gas. Increased hepatic echogenicity consistent fatty infiltration or hepatocellular disease. Portal vein is patent on color Doppler imaging with normal direction of blood flow towards the liver. IVC: No abnormality visualized. Pancreas: Not visualized Spleen: Poorly visualized due to overlying bowel gas. Right Kidney: Length: Poorly visualized due to overlying bowel gas. Left Kidney: Length: 10.3 cm. Echogenicity within normal limits. No mass or hydronephrosis visualized. Abdominal aorta: No aneurysm visualized. Other findings: None. IMPRESSION: Limited exam due to overlying bowel gas. No  gallstones or biliary distention identified. Increased hepatic echogenicity consistent fatty infiltration or hepatocellular disease. Electronically Signed   By: Maisie Fus  Register   On: 02/10/2019 11:01   DG Chest Port 1 View  Result Date: 02/09/2019 CLINICAL DATA:  Weakness, found down in living quarters EXAM: PORTABLE CHEST 1 VIEW COMPARISON:  Radiograph 11/09/2018, CT October 11, 2004 FINDINGS: Mildly increased interstitial opacity without focal consolidation. No pneumothorax or effusion. The aorta is calcified. The remaining cardiomediastinal contours are unremarkable. Degenerative changes are present in the imaged spine and shoulders. Stable remote  posttraumatic deformity of the distal right clavicle. IMPRESSION: 1. Mildly increased interstitial opacity without focal consolidation. Possibly edematous or inflammatory. 2. Stable remote posttraumatic deformity of the distal right clavicle. Electronically Signed   By: Kreg Shropshire M.D.   On: 02/09/2019 20:31   EEG adult  Result Date: 02/10/2019 Charlsie Quest, MD     02/10/2019  2:58 PM Patient Name: Tim Schwartz MRN: [id number] Epilepsy Attending: Charlsie Quest Referring Physician/Provider: Dr. Mauro Kaufmann Date: 02/10/2019 Duration: 24.26 mins Patient history: 53 year old male who presented with spells concerning for syncope versus seizure.  EEG to evaluate for seizure. Level of alertness: Awake AEDs during EEG study: Xanax Technical aspects: This EEG study was done with scalp electrodes positioned according to the 10-20 International system of electrode placement. Electrical activity was acquired at a sampling rate of 500Hz  and reviewed with a high frequency filter of 70Hz  and a low frequency filter of 1Hz . EEG data were recorded continuously and digitally stored. Description:The posterior dominant rhythm consists of 9-10 Hz activity of moderate voltage (25-35 uV) seen predominantly in posterior head regions, symmetric and reactive to eye opening and eye closing. There is an excessive amount of 15 to 18 Hz, 2-3 uV beta activity with irregular morphology distributed symmetrically and diffusely. Hyperventilation and photic stimulation were not performed. Abnormality -Excessive beta, generalized IMPRESSION: This study is within normal limits. The excessive beta activity seen in the background is most likely due to the effect of benzodiazepine and is a benign EEG pattern.  No seizures or epileptiform discharges were seen throughout the recording.    Scheduled Meds: . citalopram  40 mg Oral QHS  . enoxaparin (LOVENOX) injection  40 mg Subcutaneous Q24H  . topiramate  25 mg Oral TID   Continuous  Infusions: . lactated ringers 75 mL/hr at 02/10/19 1158     LOS: 1 day    Time spent: 30 minutes.    , MD [facility] Pager [phone]   02/10/2019, 5:34 PM

## 2019-02-11 ENCOUNTER — Inpatient Hospital Stay (HOSPITAL_COMMUNITY): Payer: Medicaid Other

## 2019-02-11 DIAGNOSIS — F419 Anxiety disorder, unspecified: Secondary | ICD-10-CM

## 2019-02-11 DIAGNOSIS — R7401 Elevation of levels of liver transaminase levels: Secondary | ICD-10-CM

## 2019-02-11 DIAGNOSIS — R55 Syncope and collapse: Secondary | ICD-10-CM

## 2019-02-11 DIAGNOSIS — F329 Major depressive disorder, single episode, unspecified: Secondary | ICD-10-CM

## 2019-02-11 DIAGNOSIS — G894 Chronic pain syndrome: Secondary | ICD-10-CM

## 2019-02-11 LAB — BASIC METABOLIC PANEL
Anion gap: 8 (ref 5–15)
BUN: 22 mg/dL — ABNORMAL HIGH (ref 6–20)
CO2: 22 mmol/L (ref 22–32)
Calcium: 8.3 mg/dL — ABNORMAL LOW (ref 8.9–10.3)
Chloride: 108 mmol/L (ref 98–111)
Creatinine, Ser: 0.77 mg/dL (ref 0.61–1.24)
GFR calc Af Amer: >60 mL/min (ref >60)
GFR calc non Af Amer: >60 mL/min (ref >60)
Glucose, Bld: 82 mg/dL (ref 70–99)
Potassium: 3.4 mmol/L — ABNORMAL LOW (ref 3.5–5.1)
Sodium: 138 mmol/L (ref 135–145)

## 2019-02-11 LAB — ECHOCARDIOGRAM COMPLETE: Height: 74 in

## 2019-02-11 MED ORDER — VITAMIN B-12 1000 MCG PO TABS: 400.0000 ug | ORAL_TABLET | Freq: Every day | ORAL | 1 refills | Status: DC

## 2019-02-11 MED ORDER — VITAMIN B-12 1000 MCG PO TABS: 1000.0000 ug | ORAL_TABLET | Freq: Every day | ORAL | 1 refills | Status: AC

## 2019-02-11 MED ORDER — ENSURE ENLIVE PO LIQD: 237.0000 mL | Freq: Two times a day (BID) | ORAL | 12 refills | Status: AC

## 2019-02-11 MED ORDER — CYANOCOBALAMIN 1000 MCG/ML IJ SOLN
1000.0000 ug | Freq: Once | INTRAMUSCULAR | Status: AC
  Administered 2019-02-11: 15:00:00 1000 ug via SUBCUTANEOUS
  Filled 2019-02-11: qty 1

## 2019-02-11 MED ORDER — TOPIRAMATE 25 MG PO TABS: 25.0000 mg | ORAL_TABLET | Freq: Three times a day (TID) | ORAL | Status: AC

## 2019-02-11 MED ORDER — ALPRAZOLAM 1 MG PO TABS: 1.0000 mg | ORAL_TABLET | Freq: Three times a day (TID) | ORAL | Status: AC | PRN

## 2019-02-11 MED ORDER — POTASSIUM CHLORIDE CRYS ER 20 MEQ PO TBCR
40.0000 meq | EXTENDED_RELEASE_TABLET | ORAL | Status: AC
  Administered 2019-02-11 (×2): 40 meq via ORAL
  Filled 2019-02-11 (×2): qty 2

## 2019-02-11 MED ORDER — PERFLUTREN LIPID MICROSPHERE
1.0000 mL | INTRAVENOUS | Status: AC | PRN
  Administered 2019-02-11: 1 mL via INTRAVENOUS
  Administered 2019-02-11: 2 mL via INTRAVENOUS

## 2019-02-11 MED ORDER — ENSURE ENLIVE PO LIQD
237.0000 mL | Freq: Two times a day (BID) | ORAL | Status: DC
  Administered 2019-02-11: 237 mL via ORAL

## 2019-02-11 MED ORDER — ADULT MULTIVITAMIN W/MINERALS CH: 1.0000 | ORAL_TABLET | Freq: Every day | ORAL | 1 refills | Status: AC

## 2019-02-11 MED ORDER — ADULT MULTIVITAMIN W/MINERALS CH
1.0000 | ORAL_TABLET | Freq: Every day | ORAL | Status: DC
  Administered 2019-02-11: 1 via ORAL
  Filled 2019-02-11: qty 1

## 2019-02-11 NOTE — Progress Notes (Signed)
Physical Therapy Treatment Patient Details Name: Tim Schwartz MRN: [id number] DOB: 1966/01/09 Today's Date: 02/11/2019    History of Present Illness Tim Schwartz  is a 53 y.o. male,*with history of chronic back pain, gait abnormality, paresthesia, hypertension, chronic opioid use was brought to hospital by EMS after patient's mother found him on the ground unresponsive.  Patient lives in a small shed behind his mother's house.  When she went to check on him he was found laying on the ground and having defecated on himself.  EMS was called and patient was transported to the ED for further evaluation.  Patient is a poor historian, he is choosing not to answer my questions though he is alert and responding to my questions by head nodding.    PT Comments    Patient presents alert and had verbalized responses to some questions mostly when addressing his needs.  Patient able to transfer to commode in bathroom, had difficulty sitting up from commode and bedside due to BLE weakness, demonstrates left foot drop during ambulation which is probably baseline from his old CVA, limited for ambulation due to fatigue and has tendency to push RW to far in front once fatigued requiring assistance to avoid losing balance.  Patient ignored request to sit up in chair after gait training and went back to bed possibly due to fatigue.  Patient will benefit from continued physical therapy in hospital and recommended venue below to increase strength, balance, endurance for safe ADLs and gait.   Follow Up Recommendations  SNF;Supervision - Intermittent;Supervision for mobility/OOB     Equipment Recommendations  Rolling walker with 5" wheels    Recommendations for Other Services       Precautions / Restrictions Precautions Precautions: Fall Restrictions Weight Bearing Restrictions: No    Mobility  Bed Mobility Overal bed mobility: Needs Assistance Bed Mobility: Supine to Sit;Sit to Supine     Supine to  sit: Min assist Sit to supine: Supervision;Min guard   General bed mobility comments: labored movement, increased time for sitting up at bedside, unsafe (flops into bed) and unaware of IV insertion when getting back into bed  Transfers Overall transfer level: Needs assistance Equipment used: Rolling walker (2 wheeled) Transfers: Sit to/from [facility] Sit to Stand: Min assist Stand pivot transfers: Min assist       General transfer comment: difficulty with sit to stands due to BLE weakness  Ambulation/Gait Ambulation/Gait assistance: Min assist;Mod assist Gait Distance (Feet): 60 Feet Assistive device: Rolling walker (2 wheeled) Gait Pattern/deviations: Decreased step length - right;Decreased step length - left;Decreased stride length Gait velocity: decreased   General Gait Details: unsteady cadence with tendency to push RW to far in front once fatigued, has difficulty making turns requiring assistance to avoid loss of balance, limited secondary to fatigue   Stairs             Wheelchair Mobility    Modified Rankin (Stroke Patients Only)       Balance Overall balance assessment: Needs assistance Sitting-balance support: Feet supported;Bilateral upper extremity supported Sitting balance-Leahy Scale: Fair Sitting balance - Comments: fair/good seated at EOB   Standing balance support: Bilateral upper extremity supported;During functional activity Standing balance-Leahy Scale: Fair Standing balance comment: using RW                            Cognition Arousal/Alertness: Awake/alert Behavior During Therapy: Flat affect;Impulsive Overall Cognitive Status: No family/caregiver present to determine baseline  cognitive functioning                                 General Comments: Presents more alert and occasionally verbal      Exercises General Exercises - Lower Extremity Long Arc Quad: Seated;AROM;Strengthening;Both;10  reps Hip Flexion/Marching: Seated;AROM;Strengthening;Both;10 reps Toe Raises: Seated;AROM;Strengthening;Both;5 reps Heel Raises: Seated;AROM;Strengthening;Both;10 reps    General Comments        Pertinent Vitals/Pain Pain Assessment: No/denies pain    Home Living                      Prior Function            PT Goals (current goals can now be found in the care plan section) Acute Rehab PT Goals Patient Stated Goal: not stated PT Goal Formulation: With patient Time For Goal Achievement: 02/24/19 Potential to Achieve Goals: Good Progress towards PT goals: Progressing toward goals    Frequency    Min 3X/week      PT Plan Current plan remains appropriate    Co-evaluation              AM-PAC PT "6 Clicks" Mobility   Outcome Measure  Help needed turning from your back to your side while in a flat bed without using bedrails?: A Little Help needed moving from lying on your back to sitting on the side of a flat bed without using bedrails?: A Little Help needed moving to and from a bed to a chair (including a wheelchair)?: A Little Help needed standing up from a chair using your arms (e.g., wheelchair or bedside chair)?: A Lot Help needed to walk in hospital room?: A Lot Help needed climbing 3-5 steps with a railing? : A Lot 6 Click Score: 15    End of Session   Activity Tolerance: Patient tolerated treatment well;Patient limited by fatigue Patient left: in bed;with call bell/phone within reach;with bed alarm set Nurse Communication: Mobility status PT Visit Diagnosis: Unsteadiness on feet (R26.81);Other abnormalities of gait and mobility (R26.89);Muscle weakness (generalized) (M62.81)     Time: 9326-7124 PT Time Calculation (min) (ACUTE ONLY): 30 min  Charges:  $Gait Training: 8-22 mins $Therapeutic Exercise: 8-22 mins                     2:35 PM, 02/11/19 Ocie Bob, MPT Physical Therapist with [facility] [phone]  office [phone] mobile phone

## 2019-02-11 NOTE — Progress Notes (Signed)
Initial Nutrition Assessment  DOCUMENTATION CODES:   Not applicable  INTERVENTION:  -Ensure Enlive po BID, each supplement provides 350 kcal and 20 grams of protein (chocolate)  -MVI with minerals daily  -Obtain current weight  Recommend monitoring  magnesium, potassium, and phosphorus daily for at least 3 days, MD to replete as needed, as pt is at risk for refeeding syndrome given nutrition history provided by daughter.  NUTRITION DIAGNOSIS:   Inadequate oral intake related to social / environmental circumstances as evidenced by per patient/family report, energy intake < or equal to 50% for > or equal to 1 month(per pt daughter, limited intake secondary to polysubstance abuse and recent weight loss).  GOAL:   Patient will meet greater than or equal to 90% of their needs  MONITOR:   Labs, I & O's, Supplement acceptance, Weight trends, PO intake  REASON FOR ASSESSMENT:   Malnutrition Screening Tool    ASSESSMENT:  RD working remotely.  53 year old male with history of chronic back pain, gait abnormality, paresthesia, HTN, chronic opioid use, h/o stroke, who was brought to hospital via EMS after patient's mother found him on the ground unresponsive and had defecated on himself. Upon arrival, patient alert, but noted to be a poor historian; only answering questions with head nods. Patient admitted for further evaluation of fall of unclear etiology; syncopal episode verses seizure.  EEG completed 12/15, study noted within normal limits.   Patient positive for benzodiazepines on urine drug screen. Per chart review, pt is on Flexeril, fentanyl, and Xanax at home. Possible overdose of medication related to fall per chart review. Home medications currently held.  Patient documented with 75% po intake of breakfast tray. Spoke with daughter via phone this morning. She reports that her father lives in a little house on his mother's property. Usually, patient's mother would prepare meals  and take a plate to him. Daughter states recently, pt has "been living off fruit roll-ups" as per report of patients mother. Daughter states that she doubts he has really eaten much of  anything over the past couple of months secondary to substance abuse. She reports patient "has been shooting up heroin and doing meth"    Per history provided by daughter, pt is at risk for refeeding and recommend monitoring magnesium, potassium, and phosphorus.   Daughter endorses severe weight losses over the past few months and stated "I don't think he even weighs as much as I do and I am 130 lbs" Current wt 96.7 kg (212.7 lbs) recorded in 11/2017 Recommend obtaining new wt to fully assess needs.  Highly suspect severe malnutrition given reported nutrition history and weight losses endorsed by patient's daughter. Unfortunately, unable to diagnose at this time secondary to 2019 anthropometrics as well as RD working remotely. RD will see patient 12/17 for NFPE. At this time, will provide Ensure to aid with calorie/protein needs. Per daughter, pt likes chocolate or vanilla.   Medications reviewed and include: Folic acid 1 mg daily, Klor-con 40 mEq every 4 hrs, Thiamine 100 mg daily  Labs: K 3.4 (L), BUN 22 (H), Ca 8.3 (L)  NUTRITION - FOCUSED PHYSICAL EXAM: Unable to complete at this time, RD working remotely.  Diet Order:   Diet Order            Diet regular Room service appropriate? Yes; Fluid consistency: Thin  Diet effective now              EDUCATION NEEDS:   No education needs have been  identified at this time  Skin:  Skin Assessment: Reviewed RN Assessment  Last BM:  PTA  Height:   Ht Readings from Last 1 Encounters:  02/09/19 6\' 2"  (1.88 m)    Weight:   Wt Readings from Last 1 Encounters:  12/09/17 96.7 kg    Ideal Body Weight:  86.4 kg  BMI:  Body mass index is 27.37 kg/m.  Estimated Nutritional Needs: Based on last recorded wt 96.7 kg   Kcal:  2200-2400  Protein:   110-120  Fluid:  >/= 2.2 L/day    12/11/17, RD, LDN Clinical Nutrition [facility] Telephone [phone] After Hours/Weekend Pager: [phone]

## 2019-02-11 NOTE — Progress Notes (Signed)
*  PRELIMINARY RESULTS* Echocardiogram 2D Echocardiogram has been performed with [facility].  Tim Schwartz 02/11/2019, 12:45 PM

## 2019-02-11 NOTE — Discharge Summary (Signed)
Physician Discharge Summary  Tim Schwartz TDV:[id number] DOB: March 03, 1965 DOA: 02/09/2019  PCP: Tim Gravel, MD  Admit date: 02/09/2019 Discharge date: 02/11/2019  Time spent: 35 minutes  Recommendations for Outpatient Follow-up:  1. Repeat complete metabolic panel to follow electrolytes, LFTs and renal function 2. Reassess blood pressure and initiate antihypertensive regimen if required (patient with prior history of stroke).   Discharge Diagnoses:  Active Problems:   Fall   Syncope   Transaminitis   Hypomagnesemia   Chronic pain syndrome   Anxiety and depression Prior history of stroke Hypokalemia History of migraine and chronic dizziness  Discharge Condition: Stable and improved.  Patient discharged home with instruction to follow-up with PCP in 10 days.  Diet recommendation: Heart healthy diet.  History of present illness:  53 y.o.male with history of chronic back pain, gait abnormality, paresthesia, hypertension, chronic opioid use was brought to hospital by EMS after patient's mother found him on the ground unresponsive. Patient lives in a small shed behind his mother's house. When she went to check on him he was found laying on the ground and having defecated on himself. EMS was called and patient was transported to the ED for further evaluation. Patient is a poor historian, he is choosing not to answer my questions though he is alert and responding to my questions by head nodding. He denies chest pain or shortness of breath. Does not remember whether he passed out or not. Denies abdominal pain or dysuria. Unfortunately no other history is obtainable as patient is not very cooperative at this time.  COVID test neg  Hospital Course:  1-fall/syncope -Negative EEG -Negative troponin, no abnormalities on telemetry and unremarkable 2D echo. -Physical therapy recommended patient to go to skilled nursing facility; this has been declined by patient.  Patient will go  back home with family care and the use of rolling walker. -Evaluation has suggested nonintentional medication side effects and borderline low B12 level as cause for his passing out episode. -Patient was also mildly dehydrated and after fluids no further complaints appreciated. -Instructions to follow-up with PCP in 10 days provided -Patient discharged on 1000 mcg B12 tablet to be taken daily; and 1 shot subcutaneously given prior to discharge.  2-hypokalemia/hypomagnesemia -Repleted -Patient instructed to maintain adequate hydration and nutrition -Follow recommendations by nutritional service patient discharged on Ensure twice a day.  3-history of headaches/chronic dizziness -Continue Topamax and as needed meclizine.  4-anxiety/depression -No suicidal ideation -Flat affect appreciated -No hallucinations. -Continue Celexa and as needed Xanax.  5-chronic diastolic heart failure -Compensated -Instructed to follow daily weights and low-sodium diet.  6-chronic pain syndrome -Continue as needed fentanyl as previously instructed. -Continue outpatient follow-up with PCP. -No narcotics prescribed.  7-prior history of stroke -Residual left foot drop (appears to be at baseline) -Continue aspirin for secondary prevention -Physical therapy has recommended patient to go to skilled nursing facility for increased strength and balance; this has been declined by patient.  Procedures:  See below for x-ray reports  EEG: Negative for epilepsy  -2D echo:  1. Left ventricular ejection fraction, by visual estimation, is 55 to 60%. The left ventricle has normal function. There is borderline left ventricular hypertrophy.  2. Definity contrast agent was given IV to delineate the left ventricular endocardial borders.  3. The left ventricle has no regional wall motion abnormalities.  4. Global right ventricle has normal systolic function.The right ventricular size is normal. No increase in right  ventricular wall thickness.  5. Left atrial size was normal.  6. Right atrial size was normal.  7. The mitral valve is grossly normal. Trivial mitral valve regurgitation.  8. The tricuspid valve is grossly normal. Tricuspid valve regurgitation is trivial.  9. The aortic valve was not well visualized. Aortic valve regurgitation is not visualized. 10. The pulmonic valve was not well visualized. Pulmonic valve regurgitation is not visualized. 11. TR signal is inadequate for assessing pulmonary artery systolic pressure.  Consultations:  Physical therapy: Recommendations given for nursing home; patient has declined that offer.  Expressed that he will accept home DME's (rolling walker recommended) and will go home.  Discharge Exam: Vitals:   02/11/19 0512 02/11/19 1329  BP: 138/78 (!) 148/76  Pulse: 81 74  Resp: 18 17  Temp: 98.6 F (37 C) 98.1 F (36.7 C)  SpO2: 99% 93%    General: Alert, awake and oriented x3; patient denies chest pain, no shortness of breath, no nausea, no vomiting.  Reports no further episodes of passing out or new focal weaknesses.  Good oxygen saturation on room air.  Wants to go home Cardiovascular: S1 and S2, no rubs, no gallops, no JVD. Respiratory: Good air movement bilaterally, no wheezing, no crackles. Abdomen: Soft, nontender, positive bowel sounds, no distention or guarding. Extremities: No cyanosis or clubbing.  Discharge Instructions   Discharge Instructions    Diet - low sodium heart healthy   Complete by: As directed    Discharge instructions   Complete by: As directed    Maintain adequate hydration Follow heart healthy diet Take medications as prescribed Arrange follow-up with PCP in 10 days     Allergies as of 02/11/2019   No Known Allergies     Medication List    STOP taking these medications   cyclobenzaprine 10 MG tablet Commonly known as: FLEXERIL   zolpidem 10 MG tablet Commonly known as: AMBIEN     TAKE these medications    ALPRAZolam 1 MG tablet Commonly known as: XANAX Take 1 tablet (1 mg total) by mouth every 8 (eight) hours as needed for anxiety. Patient takes 1 tablet in the morning and 2 tablets at bedtime What changed:   when to take this  reasons to take this   aspirin EC 81 MG tablet Take 81 mg by mouth daily.   citalopram 40 MG tablet Commonly known as: CELEXA Take 40 mg by mouth at bedtime.   feeding supplement (ENSURE ENLIVE) Liqd Take 237 mLs by mouth 2 (two) times daily between meals. Start taking on: February 12, 2019   fentaNYL 100 MCG/HR Commonly known as: DURAGESIC Place 1 patch onto the skin every 3 (three) days.   halobetasol 0.05 % ointment Commonly known as: ULTRAVATE Apply 1 application topically 2 (two) times daily.   ibuprofen 800 MG tablet Commonly known as: ADVIL Take 800 mg by mouth daily as needed for pain.   meclizine 25 MG tablet Commonly known as: ANTIVERT Take 1 tablet (25 mg total) by mouth 3 (three) times daily as needed for dizziness.   multivitamin with minerals Tabs tablet Take 1 tablet by mouth daily. Start taking on: February 12, 2019   PRESCRIPTION MEDICATION Apply 1 application topically daily as needed (breakout). salacid 50% petroleum jelly   topiramate 25 MG tablet Commonly known as: TOPAMAX Take 1 tablet (25 mg total) by mouth 3 (three) times daily. What changed:   how much to take  how to take this  when to take this  additional instructions   vitamin B-12 1000 MCG tablet Commonly known  as: CYANOCOBALAMIN Take 1 tablet (1,000 mcg total) by mouth daily. What changed:   medication strength  how much to take            Durable Medical Equipment  (From admission, onward)         Start     Ordered   02/11/19 1324  For home use only DME Walker rolling  Once    Question:  Patient needs a walker to treat with the following condition  Answer:  Poor balance   02/11/19 1323         No Known Allergies Follow-up  Information    Pearson Grippe, MD. Schedule an appointment as soon as possible for a visit in 10 day(s).   Specialty: Internal Medicine Contact information: [address] [place] [address] [phone]           The results of significant diagnostics from this hospitalization (including imaging, microbiology, ancillary and laboratory) are listed below for reference.    Significant Diagnostic Studies: CT Head Wo Contrast  Result Date: 02/09/2019 CLINICAL DATA:  Found down EXAM: CT HEAD WITHOUT CONTRAST TECHNIQUE: Contiguous axial images were obtained from the base of the skull through the vertex without intravenous contrast. COMPARISON:  CT 12/26/2018 FINDINGS: Brain: Stable region of gliosis in the left centrum semiovale (2/19). No evidence of acute infarction, hemorrhage, hydrocephalus, extra-axial collection or mass lesion/mass effect. Symmetric prominence of the ventricles, cisterns and sulci compatible with frontal lobe predominant parenchymal volume loss. Patchy areas of white matter hypoattenuation are most compatible with chronic microvascular angiopathy. Vascular: No hyperdense vessel or unexpected calcification. Skull: No calvarial fracture or suspicious osseous lesion. No scalp swelling or hematoma. Sinuses/Orbits: Few pneumatized secretions in the left maxillary sinus. Remaining paranasal sinuses are predominantly clear. Hypopneumatization of the left mastoid air cells. Included orbital structures are unremarkable. Other: None IMPRESSION: 1. No acute intracranial abnormality. 2. Stable region of gliosis in the left centrum semiovale. 3. Stable frontal lobe predominant parenchymal volume loss and chronic microvascular angiopathy. 4. Pneumatized secretions in the left maxillary sinus, could correlate for features of acute sinusitis. Electronically Signed   By: Kreg Shropshire M.D.   On: 02/09/2019 22:04   Abdominal ultrasound  Result Date: 02/10/2019 CLINICAL DATA:   Transaminitis. EXAM: ABDOMEN ULTRASOUND COMPLETE COMPARISON:  CT 11/07/2012. FINDINGS: Gallbladder: Limited evaluation due to overlying bowel gas. No gallstones identified. Gallbladder wall thickness 2.9 mm. Common bile duct: Diameter: 3.5 mm Liver: Limited evaluation due to overlying bowel gas. Increased hepatic echogenicity consistent fatty infiltration or hepatocellular disease. Portal vein is patent on color Doppler imaging with normal direction of blood flow towards the liver. IVC: No abnormality visualized. Pancreas: Not visualized Spleen: Poorly visualized due to overlying bowel gas. Right Kidney: Length: Poorly visualized due to overlying bowel gas. Left Kidney: Length: 10.3 cm. Echogenicity within normal limits. No mass or hydronephrosis visualized. Abdominal aorta: No aneurysm visualized. Other findings: None. IMPRESSION: Limited exam due to overlying bowel gas. No gallstones or biliary distention identified. Increased hepatic echogenicity consistent fatty infiltration or hepatocellular disease. Electronically Signed   By: Maisie Fus  Register   On: 02/10/2019 11:01   DG Chest Port 1 View  Result Date: 02/09/2019 CLINICAL DATA:  Weakness, found down in living quarters EXAM: PORTABLE CHEST 1 VIEW COMPARISON:  Radiograph 11/09/2018, CT October 11, 2004 FINDINGS: Mildly increased interstitial opacity without focal consolidation. No pneumothorax or effusion. The aorta is calcified. The remaining cardiomediastinal contours are unremarkable. Degenerative changes are present in the imaged  spine and shoulders. Stable remote posttraumatic deformity of the distal right clavicle. IMPRESSION: 1. Mildly increased interstitial opacity without focal consolidation. Possibly edematous or inflammatory. 2. Stable remote posttraumatic deformity of the distal right clavicle. Electronically Signed   By: Kreg ShropshirePrice  DeHay M.D.   On: 02/09/2019 20:31   EEG adult  Result Date: 02/10/2019 Charlsie QuestYadav, Priyanka O, MD     02/10/2019  2:58  PM Patient Name: Tim KernKenneth A Hitchcock MRN: [id number] Epilepsy Attending: Charlsie QuestPriyanka O Yadav Referring Physician/Provider: Dr. Mauro KaufmannGagan Lama Date: 02/10/2019 Duration: 24.26 mins Patient history: 53 year old male who presented with spells concerning for syncope versus seizure.  EEG to evaluate for seizure. Level of alertness: Awake AEDs during EEG study: Xanax Technical aspects: This EEG study was done with scalp electrodes positioned according to the 10-20 International system of electrode placement. Electrical activity was acquired at a sampling rate of 500Hz  and reviewed with a high frequency filter of 70Hz  and a low frequency filter of 1Hz . EEG data were recorded continuously and digitally stored. Description:The posterior dominant rhythm consists of 9-10 Hz activity of moderate voltage (25-35 uV) seen predominantly in posterior head regions, symmetric and reactive to eye opening and eye closing. There is an excessive amount of 15 to 18 Hz, 2-3 uV beta activity with irregular morphology distributed symmetrically and diffusely. Hyperventilation and photic stimulation were not performed. Abnormality -Excessive beta, generalized IMPRESSION: This study is within normal limits. The excessive beta activity seen in the background is most likely due to the effect of benzodiazepine and is a benign EEG pattern.  No seizures or epileptiform discharges were seen throughout the recording.   ECHOCARDIOGRAM COMPLETE  Result Date: 02/11/2019   ECHOCARDIOGRAM REPORT   Patient Name:   Tim KernKENNETH A Drees Date of Exam: 02/11/2019 Medical Rec #:  [id number]       Height:       74.0 in Accession #:    [id number]      Weight:       213.2 lb Date of Birth:  08/02/1965        BSA:          2.23 m Patient Age:    53 years        BP:           138/78 mmHg Patient Gender: M               HR:           81 bpm. [facility] Procedure: 2D Echo, Cardiac Doppler and Color Doppler Indications:    Syncope 780.2 / R55  History:        Patient  has prior history of Echocardiogram examinations, most                 recent 06/05/2016. Risk Factors:Hypertension and Dyslipidemia.                 Stroke [facility](HCC) (From Hx), This Admit: Gait abnormality, Syncopy,                 Fall. Opioid use with withdrawal.  Sonographer:    Celesta GentileBernard White RCS Referring Phys: [phone] Zelena Bushong  Sonographer Comments: Technically Difficult Study IMPRESSIONS  1. Left ventricular ejection fraction, by visual estimation, is 55 to 60%. The left ventricle has normal function. There is borderline left ventricular hypertrophy.  2. Definity contrast agent was given IV to delineate the left ventricular endocardial borders.  3. The left ventricle has no regional wall motion abnormalities.  4. Global right ventricle has normal systolic function.The right ventricular size is normal. No increase in right ventricular wall thickness.  5. Left atrial size was normal.  6. Right atrial size was normal.  7. The mitral valve is grossly normal. Trivial mitral valve regurgitation.  8. The tricuspid valve is grossly normal. Tricuspid valve regurgitation is trivial.  9. The aortic valve was not well visualized. Aortic valve regurgitation is not visualized. 10. The pulmonic valve was not well visualized. Pulmonic valve regurgitation is not visualized. 11. TR signal is inadequate for assessing pulmonary artery systolic pressure. FINDINGS  Left Ventricle: Left ventricular ejection fraction, by visual estimation, is 55 to 60%. The left ventricle has normal function. Definity contrast agent was given IV to delineate the left ventricular endocardial borders. The left ventricle has no regional wall motion abnormalities. The left ventricular internal cavity size was the left ventricle is normal in size. There is borderline left ventricular hypertrophy. Left ventricular diastolic parameters were normal. Right Ventricle: The right ventricular size is normal. No increase in right ventricular wall thickness. Global  RV systolic function is has normal systolic function. Left Atrium: Left atrial size was normal in size. Right Atrium: Right atrial size was normal in size Pericardium: There is no evidence of pericardial effusion. Mitral Valve: The mitral valve is grossly normal. Trivial mitral valve regurgitation. Tricuspid Valve: The tricuspid valve is grossly normal. Tricuspid valve regurgitation is trivial. Aortic Valve: The aortic valve was not well visualized. Aortic valve regurgitation is not visualized. Pulmonic Valve: The pulmonic valve was not well visualized. Pulmonic valve regurgitation is not visualized. Pulmonic regurgitation is not visualized. Aorta: The aortic root is normal in size and structure. IAS/Shunts: No atrial level shunt detected by color flow Doppler.  LEFT VENTRICLE PLAX 2D LVIDd:         3.54 cm  Diastology LVIDs:         2.26 cm  LV e' lateral:   14.30 cm/s LV PW:         1.13 cm  LV E/e' lateral: 6.1 LV IVS:        1.00 cm  LV e' medial:    8.49 cm/s LVOT diam:     1.95 cm  LV E/e' medial:  10.4 LV SV:         35 ml LV SV Index:   15.58 LVOT Area:     2.99 cm  RIGHT VENTRICLE RV S prime:     17.40 cm/s TAPSE (M-mode): 2.4 cm LEFT ATRIUM             Index       RIGHT ATRIUM           Index LA diam:        2.65 cm 1.19 cm/m  RA Area:     19.70 cm LA Vol (A2C):   21.4 ml 9.58 ml/m  RA Volume:   60.60 ml  27.13 ml/m LA Vol (A4C):   53.5 ml 23.96 ml/m LA Biplane Vol: 36.3 ml 16.25 ml/m  AORTIC VALVE LVOT Vmax:   82.00 cm/s LVOT Vmean:  53.800 cm/s LVOT VTI:    0.181 m  AORTA Ao Root diam: 3.40 cm MITRAL VALVE MV Area (PHT): 3.48 cm             SHUNTS MV PHT:        63.22 msec           Systemic VTI:  0.18 m MV Decel Time: 218 msec  Systemic Diam: 1.95 cm MV E velocity: 87.90 cm/s 103 cm/s MV A velocity: 89.80 cm/s 70.3 cm/s MV E/A ratio:  0.98       1.5  Nona Dell MD Electronically signed by Nona Dell MD Signature Date/Time: 02/11/2019/4:08:23 PM    Final      Microbiology: Recent Results (from the past 240 hour(s))  Blood culture (routine x 2)     Status: None (Preliminary result)   Collection Time: 02/09/19  7:45 PM   Specimen: BLOOD  Result Value Ref Range Status   Specimen Description BLOOD RIGHT ANTECUBITAL  Final   Special Requests   Final    BOTTLES DRAWN AEROBIC AND ANAEROBIC Blood Culture adequate volume   Culture   Final    NO GROWTH 2 DAYS Performed at [facility], [address] [place], [place] [address]    Report Status PENDING  Incomplete  Blood culture (routine x 2)     Status: None (Preliminary result)   Collection Time: 02/09/19 11:10 PM   Specimen: BLOOD  Result Value Ref Range Status   Specimen Description BLOOD LEFT ANTECUBITAL  Final   Special Requests   Final    BOTTLES DRAWN AEROBIC AND ANAEROBIC Blood Culture adequate volume   Culture   Final    NO GROWTH 2 DAYS Performed at [facility], [address] [place] [address] [place], [place] [address]    Report Status PENDING  Incomplete  Respiratory Panel by RT PCR (Flu A&B, Covid) - Nasopharyngeal Swab     Status: None   Collection Time: 02/10/19  5:15 AM   Specimen: Nasopharyngeal Swab  Result Value Ref Range Status   SARS Coronavirus 2 by RT PCR NEGATIVE NEGATIVE Final    Comment: (NOTE) SARS-CoV-2 target nucleic acids are NOT DETECTED. The SARS-CoV-2 RNA is generally detectable in upper respiratoy specimens during the acute phase of infection. The lowest concentration of SARS-CoV-2 viral copies this assay can detect is 131 copies/mL. A negative result does not preclude SARS-Cov-2 infection and should not be used as the sole basis for treatment or other patient management decisions. A negative result may occur with  improper specimen collection/handling, submission of specimen other than nasopharyngeal swab, presence of viral mutation(s) within the areas targeted by this assay, and inadequate number of viral copies (<131 copies/mL). A negative result must be combined  with clinical observations, patient history, and epidemiological information. The expected result is Negative. Fact Sheet for Patients:  https://www.moore.com/ Fact Sheet for Healthcare Providers:  https://www.young.biz/ This test is not yet ap proved or cleared by the [place] FDA and  has been authorized for detection and/or diagnosis of SARS-CoV-2 by FDA under an Emergency Use Authorization (EUA). This EUA will remain  in effect (meaning this test can be used) for the duration of the COVID-19 declaration under Section 564(b)(1) of the Act, 21 U.S.C. section 360bbb-3(b)(1), unless the authorization is terminated or revoked sooner.    Influenza A by PCR NEGATIVE NEGATIVE Final   Influenza B by PCR NEGATIVE NEGATIVE Final    Comment: (NOTE) The Xpert Xpress SARS-CoV-2/FLU/RSV assay is intended as an aid in  the diagnosis of influenza from Nasopharyngeal swab specimens and  should not be used as a sole basis for treatment. Nasal washings and  aspirates are unacceptable for Xpert Xpress SARS-CoV-2/FLU/RSV  testing. Fact Sheet for Patients: https://www.moore.com/ Fact Sheet for Healthcare Providers: https://www.young.biz/ This test is not yet approved or cleared by the [place] FDA and  has been authorized for detection and/or diagnosis  of SARS-CoV-2 by  FDA under an Emergency Use Authorization (EUA). This EUA will remain  in effect (meaning this test can be used) for the duration of the  Covid-19 declaration under Section 564(b)(1) of the Act, 21  U.S.C. section 360bbb-3(b)(1), unless the authorization is  terminated or revoked. Performed at [facility], [address], [place], [place] [address]      Labs: Basic Metabolic Panel: Recent Labs  Lab 02/09/19 1943 02/10/19 0514 02/10/19 1027 02/11/19 0414  NA 137 139 137 138  K 2.8* 2.6* 3.1* 3.4*  CL 102 104 105 108  CO2 25 25 23 22    GLUCOSE 132* 94 86 82  BUN 23* 21* 21* 22*  CREATININE 1.02 0.93 0.82 0.77  CALCIUM 8.8* 8.4* 8.3* 8.3*  MG  --  2.1  --   --    Liver Function Tests: Recent Labs  Lab 02/09/19 1943 02/10/19 0514  AST 81* 71*  ALT 147* 116*  ALKPHOS 178* 150*  BILITOT 1.9* 1.9*  PROT 8.2* 6.8  ALBUMIN 3.8 3.2*    Recent Labs  Lab 02/09/19 1944  AMMONIA 16   CBC: Recent Labs  Lab 02/09/19 1943 02/10/19 0514  WBC 21.0* 14.8*  NEUTROABS 18.5*  --   HGB 17.2* 15.2  HCT 50.8 45.1  MCV 95.0 96.2  PLT 254 203   Cardiac Enzymes: Recent Labs  Lab 02/09/19 1944  CKTOTAL 209    Signed:  02/11/19 MD.  [facility] 02/11/2019, 4:45 PM

## 2019-02-14 LAB — CULTURE, BLOOD (ROUTINE X 2)
Culture: NO GROWTH
Culture: NO GROWTH
Special Requests: ADEQUATE
Special Requests: ADEQUATE

## 2019-06-27 DEATH — deceased
